# Patient Record
Sex: Female | Born: 1950 | Race: White | Hispanic: No | Marital: Married | Smoking: Never smoker
Health system: Southern US, Community
[De-identification: ages and names within clinical notes are randomized; demographics above are authoritative.]

## PROBLEM LIST (undated history)

## (undated) ENCOUNTER — Ambulatory Visit

## (undated) DIAGNOSIS — Z801 Family history of malignant neoplasm of trachea, bronchus and lung: Secondary | ICD-10-CM

## (undated) DIAGNOSIS — F419 Anxiety disorder, unspecified: Secondary | ICD-10-CM

## (undated) DIAGNOSIS — Z808 Family history of malignant neoplasm of other organs or systems: Secondary | ICD-10-CM

## (undated) DIAGNOSIS — Z8249 Family history of ischemic heart disease and other diseases of the circulatory system: Secondary | ICD-10-CM

## (undated) DIAGNOSIS — E039 Hypothyroidism, unspecified: Secondary | ICD-10-CM

## (undated) DIAGNOSIS — G629 Polyneuropathy, unspecified: Secondary | ICD-10-CM

## (undated) DIAGNOSIS — F32A Depression, unspecified: Secondary | ICD-10-CM

## (undated) DIAGNOSIS — C50919 Malignant neoplasm of unspecified site of unspecified female breast: Secondary | ICD-10-CM

## (undated) DIAGNOSIS — Z803 Family history of malignant neoplasm of breast: Secondary | ICD-10-CM

## (undated) HISTORY — DX: Family history of ischemic heart disease and other diseases of the circulatory system: Z82.49

## (undated) HISTORY — DX: Family history of malignant neoplasm of trachea, bronchus and lung: Z80.1

## (undated) HISTORY — PX: OVARY SURGERY: SHX727

## (undated) HISTORY — DX: Family history of malignant neoplasm of breast: Z80.3

## (undated) HISTORY — DX: Hypothyroidism, unspecified: E03.9

## (undated) HISTORY — DX: Polyneuropathy, unspecified: G62.9

## (undated) HISTORY — DX: Depression, unspecified: F32.A

## (undated) HISTORY — PX: APPENDECTOMY: SHX54

## (undated) HISTORY — DX: Malignant neoplasm of unspecified site of unspecified female breast: C50.919

## (undated) HISTORY — DX: Anxiety disorder, unspecified: F41.9

## (undated) HISTORY — DX: Family history of malignant neoplasm of other organs or systems: Z80.8

---

## 1998-04-27 ENCOUNTER — Other Ambulatory Visit: Admission: RE | Admit: 1998-04-27 | Discharge: 1998-04-27 | Payer: Self-pay | Admitting: Obstetrics & Gynecology

## 1999-05-09 ENCOUNTER — Encounter: Payer: Self-pay | Admitting: Obstetrics & Gynecology

## 1999-05-09 ENCOUNTER — Ambulatory Visit (HOSPITAL_COMMUNITY): Admission: RE | Admit: 1999-05-09 | Discharge: 1999-05-09 | Payer: Self-pay | Admitting: Obstetrics & Gynecology

## 1999-06-10 ENCOUNTER — Other Ambulatory Visit: Admission: RE | Admit: 1999-06-10 | Discharge: 1999-06-10 | Payer: Self-pay | Admitting: Obstetrics & Gynecology

## 2001-05-05 ENCOUNTER — Emergency Department (HOSPITAL_COMMUNITY): Admission: EM | Admit: 2001-05-05 | Discharge: 2001-05-06 | Payer: Self-pay | Admitting: Emergency Medicine

## 2001-05-06 ENCOUNTER — Encounter: Payer: Self-pay | Admitting: Emergency Medicine

## 2001-05-17 ENCOUNTER — Other Ambulatory Visit: Admission: RE | Admit: 2001-05-17 | Discharge: 2001-05-17 | Payer: Self-pay | Admitting: Obstetrics & Gynecology

## 2002-07-30 ENCOUNTER — Other Ambulatory Visit: Admission: RE | Admit: 2002-07-30 | Discharge: 2002-07-30 | Payer: Self-pay | Admitting: Obstetrics & Gynecology

## 2019-07-09 ENCOUNTER — Telehealth: Payer: Self-pay | Admitting: Hematology and Oncology

## 2019-07-09 NOTE — Telephone Encounter (Signed)
Received a new patient referral from Dr. Larose Hires office in [place] for breast cancer. Norma Murray is actively being treated. She has been cld and scheduled to see Norma Murray on 7/6 at 1pm. Pt aware to arrive 15 minutes early.

## 2019-08-11 NOTE — Progress Notes (Signed)
[facility] CONSULT NOTE  Patient Care Team: System, Pcp Not In as PCP - General  CHIEF COMPLAINTS/PURPOSE OF CONSULTATION:  Metastatic breast cancer, moved from [place]  HISTORY OF PRESENTING ILLNESS:  Norma Murray 69 y.o. female is here because of a history of metastatic breast cancer. She is referred by Dr. Lebron Quam in [place]. Right breast biopsy on 09/25/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 PET scan on 05/22/19 showed resolution of the multiple osseous metastatic lesions, right chest wall mass, and bilateral axillary lymphadenopathy. She is currently on treatment with anastrozole and Zometa. She also has a history of iron deficiency anemia and thrombocytopenia.  She had a bone marrow biopsy which also revealed presence of metastatic breast cancer in the bone marrow.  She presents to the clinic today for initial evaluation.   I reviewed her records extensively and collaborated the history with the patient.  SUMMARY OF ONCOLOGIC HISTORY: Oncology History  Metastatic breast cancer ([place])  09/25/2018 Initial Diagnosis   Right breast biopsy on 09/25/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    05/22/2019 PET scan   PET scan on 05/22/19 showed resolution of the multiple osseous metastatic lesions, right chest wall mass, and bilateral axillary lymphadenopathy.     Anti-estrogen oral therapy   Anastrozole and Zometa.     MEDICAL HISTORY:  Metastatic breast cancer, hypothyroidism SURGICAL HISTORY: No prior surgeries SOCIAL HISTORY: Denies any tobacco alcohol or recreational drug use FAMILY HISTORY: 2 of her aunts had breast cancer ALLERGIES:  has no allergies on file.  MEDICATIONS:  Current Outpatient Medications  Medication Sig Dispense Refill  . abemaciclib (VERZENIO) 150 MG tablet Take 1 tablet (150 mg total) by mouth 2 (two) times daily. Swallow tablets whole. Do not chew, crush, or split  tablets before swallowing.    . anastrozole (ARIMIDEX) 1 MG tablet Take 1 tablet (1 mg total) by mouth daily. 90 tablet 3  . levothyroxine (SYNTHROID) 75 MCG tablet Take 1 tablet (75 mcg total) by mouth daily before breakfast.     No current facility-administered medications for this visit.    REVIEW OF SYSTEMS:   Constitutional: Denies fevers, chills or abnormal night sweats Eyes: Denies blurriness of vision, double vision or watery eyes Ears, nose, mouth, throat, and face: Denies mucositis or sore throat Respiratory: Denies cough, dyspnea or wheezes Cardiovascular: Denies palpitation, chest discomfort or lower extremity swelling Gastrointestinal:  Denies nausea, heartburn or change in bowel habits Skin: Denies abnormal skin rashes Lymphatics: Denies new lymphadenopathy or easy bruising Neurological:Denies numbness, tingling or new weaknesses Behavioral/Psych: Mood is stable, no new changes  Breast: Palpable right breast mass All other systems were reviewed with the patient and are negative.  PHYSICAL EXAMINATION: ECOG PERFORMANCE STATUS: 1 - Symptomatic but completely ambulatory  Vitals:   08/12/19 1250  BP: (!) 146/90  Pulse: 77  Resp: 17  Temp: 98.2 F (36.8 C)  SpO2: 100%   Filed Weights   08/12/19 1250  Weight: 133 lb 1.6 oz (60.4 kg)    GENERAL:alert, no distress and comfortable SKIN: skin color, texture, turgor are normal, no rashes or significant lesions EYES: normal, conjunctiva are pink and non-injected, sclera clear OROPHARYNX:no exudate, no erythema and lips, buccal mucosa, and tongue normal  NECK: supple, thyroid normal size, non-tender, without nodularity LYMPH:  no palpable lymphadenopathy in the cervical, axillary or inguinal LUNGS: clear to auscultation and percussion with normal breathing effort HEART: regular rate & rhythm and no murmurs  and no lower extremity edema ABDOMEN:abdomen soft, non-tender and normal bowel sounds Musculoskeletal:no cyanosis  of digits and no clubbing  PSYCH: alert & oriented x 3 with fluent speech NEURO: no focal motor/sensory deficits   RADIOGRAPHIC STUDIES: I have personally reviewed the radiological reports and agreed with the findings in the report.  ASSESSMENT AND PLAN:  Metastatic breast cancer [facility]) Metastatic breast cancer: Referred by Dr. Lebron Quam in [place]. Right breast biopsy on 09/25/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  Patient moved from [place] because she could not tolerate the heat.  PET scan on 05/22/19 showed resolution of the multiple osseous metastatic lesions, right chest wall mass, and bilateral axillary lymphadenopathy.  Current treatment: Abemaciclib, anastrozole and Zometa  Patient appears to be getting Zometa every month. She has been getting a lot of benefit from Zometa.  Her pain appears to be improved after Zometa infusion.  Therefore we will keep her Zometa every monthly schedule.   All questions were answered. The patient knows to call the clinic with any problems, questions or concerns.   Rulon Eisenmenger, MD, MPH 08/12/2019    I, Molly Dorshimer, am acting as scribe for Nicholas Lose, MD.  I have reviewed the above documentation for accuracy and completeness, and I agree with the above.

## 2019-08-12 ENCOUNTER — Inpatient Hospital Stay: Payer: Medicare PPO | Attending: Hematology and Oncology | Admitting: Hematology and Oncology

## 2019-08-12 ENCOUNTER — Other Ambulatory Visit: Payer: Self-pay

## 2019-08-12 DIAGNOSIS — Z170 Estrogen receptor positive status [ER+]: Secondary | ICD-10-CM | POA: Diagnosis not present

## 2019-08-12 DIAGNOSIS — C7951 Secondary malignant neoplasm of bone: Secondary | ICD-10-CM | POA: Diagnosis not present

## 2019-08-12 DIAGNOSIS — Z79811 Long term (current) use of aromatase inhibitors: Secondary | ICD-10-CM | POA: Diagnosis not present

## 2019-08-12 DIAGNOSIS — C50911 Malignant neoplasm of unspecified site of right female breast: Secondary | ICD-10-CM | POA: Insufficient documentation

## 2019-08-12 DIAGNOSIS — E039 Hypothyroidism, unspecified: Secondary | ICD-10-CM | POA: Diagnosis not present

## 2019-08-12 DIAGNOSIS — C50919 Malignant neoplasm of unspecified site of unspecified female breast: Secondary | ICD-10-CM | POA: Insufficient documentation

## 2019-08-12 MED ORDER — LEVOTHYROXINE SODIUM 75 MCG PO TABS: 75.0000 ug | ORAL_TABLET | Freq: Every day | ORAL | Status: DC

## 2019-08-12 MED ORDER — ANASTROZOLE 1 MG PO TABS: 1.0000 mg | ORAL_TABLET | Freq: Every day | ORAL | 3 refills | Status: DC

## 2019-08-12 MED ORDER — ABEMACICLIB 150 MG PO TABS: 150.0000 mg | ORAL_TABLET | Freq: Two times a day (BID) | ORAL | Status: DC

## 2019-08-12 NOTE — Assessment & Plan Note (Signed)
Metastatic breast cancer: Referred by Dr. Lebron Quam in [place]. Right breast biopsy on 09/25/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   PET scan on 05/22/19 showed resolution of the multiple osseous metastatic lesions, right chest wall mass, and bilateral axillary lymphadenopathy.  Current treatment: Anastrozole and Zometa

## 2019-08-13 ENCOUNTER — Telehealth: Payer: Self-pay | Admitting: Hematology and Oncology

## 2019-08-13 NOTE — Telephone Encounter (Signed)
Scheduled per 7/6 los. Called and left a msg, mailing appt letter and calendar printout

## 2019-08-18 ENCOUNTER — Telehealth: Payer: Self-pay | Admitting: Licensed Clinical Social Worker

## 2019-08-18 NOTE — Telephone Encounter (Signed)
CSW attempted to contact patient who is new to this office to introduce support services. No answer, left VM with direct contact information.   Edwinna Areola Anayiah Howden, LCSW

## 2019-08-20 ENCOUNTER — Other Ambulatory Visit: Payer: Self-pay

## 2019-08-20 ENCOUNTER — Inpatient Hospital Stay: Payer: Medicare PPO

## 2019-08-20 ENCOUNTER — Encounter: Payer: Self-pay | Admitting: Hematology and Oncology

## 2019-08-20 VITALS — BP 165/82 | HR 64 | Temp 98.3°F | Resp 16 | Wt 133.1 lb

## 2019-08-20 DIAGNOSIS — C50919 Malignant neoplasm of unspecified site of unspecified female breast: Secondary | ICD-10-CM

## 2019-08-20 DIAGNOSIS — C50911 Malignant neoplasm of unspecified site of right female breast: Secondary | ICD-10-CM | POA: Diagnosis not present

## 2019-08-20 LAB — CBC WITH DIFFERENTIAL (CANCER CENTER ONLY)
Abs Immature Granulocytes: 0.01 10*3/uL (ref 0.00–0.07)
Basophils Absolute: 0.1 10*3/uL (ref 0.0–0.1)
Basophils Relative: 2 %
Eosinophils Absolute: 0.1 10*3/uL (ref 0.0–0.5)
Eosinophils Relative: 3 %
HCT: 37.8 % (ref 36.0–46.0)
Hemoglobin: 12.4 g/dL (ref 12.0–15.0)
Immature Granulocytes: 0 %
Lymphocytes Relative: 37 %
Lymphs Abs: 1.2 10*3/uL (ref 0.7–4.0)
MCH: 34.4 pg — ABNORMAL HIGH (ref 26.0–34.0)
MCHC: 32.8 g/dL (ref 30.0–36.0)
MCV: 105 fL — ABNORMAL HIGH (ref 80.0–100.0)
Monocytes Absolute: 0.3 10*3/uL (ref 0.1–1.0)
Monocytes Relative: 11 %
Neutro Abs: 1.5 10*3/uL — ABNORMAL LOW (ref 1.7–7.7)
Neutrophils Relative %: 47 %
Platelet Count: 144 10*3/uL — ABNORMAL LOW (ref 150–400)
RBC: 3.6 MIL/uL — ABNORMAL LOW (ref 3.87–5.11)
RDW: 13.8 % (ref 11.5–15.5)
WBC Count: 3.2 10*3/uL — ABNORMAL LOW (ref 4.0–10.5)
nRBC: 0 % (ref 0.0–0.2)

## 2019-08-20 LAB — CMP (CANCER CENTER ONLY)
ALT: 126 U/L — ABNORMAL HIGH (ref 0–44)
AST: 34 U/L (ref 15–41)
Albumin: 4 g/dL (ref 3.5–5.0)
Alkaline Phosphatase: 117 U/L (ref 38–126)
Anion gap: 8 (ref 5–15)
BUN: 17 mg/dL (ref 8–23)
CO2: 23 mmol/L (ref 22–32)
Calcium: 9.2 mg/dL (ref 8.9–10.3)
Chloride: 110 mmol/L (ref 98–111)
Creatinine: 1.01 mg/dL — ABNORMAL HIGH (ref 0.44–1.00)
GFR, Est AFR Am: >60 mL/min (ref >60)
GFR, Estimated: 57 mL/min — ABNORMAL LOW (ref >60)
Glucose, Bld: 88 mg/dL (ref 70–99)
Potassium: 4.1 mmol/L (ref 3.5–5.1)
Sodium: 141 mmol/L (ref 135–145)
Total Bilirubin: 0.5 mg/dL (ref 0.3–1.2)
Total Protein: 6.9 g/dL (ref 6.5–8.1)

## 2019-08-20 MED ORDER — ZOLEDRONIC ACID 4 MG/100ML IV SOLN
4.0000 mg | Freq: Once | INTRAVENOUS | Status: AC
  Administered 2019-08-20: 4 mg via INTRAVENOUS

## 2019-08-20 MED ORDER — ZOLEDRONIC ACID 4 MG/100ML IV SOLN
INTRAVENOUS | Status: AC
  Filled 2019-08-20: qty 100

## 2019-08-20 MED ORDER — SODIUM CHLORIDE 0.9 % IV SOLN
Freq: Once | INTRAVENOUS | Status: AC
  Filled 2019-08-20: qty 250

## 2019-08-20 NOTE — Patient Instructions (Signed)
Zoledronic Acid injection (Hypercalcemia, Oncology) What is this medicine? ZOLEDRONIC ACID (ZOE le dron ik AS id) lowers the amount of calcium loss from bone. It is used to treat too much calcium in your blood from cancer. It is also used to prevent complications of cancer that has spread to the bone. This medicine may be used for other purposes; ask your health care provider or pharmacist if you have questions. COMMON BRAND NAME(S): Zometa What should I tell my health care provider before I take this medicine? They need to know if you have any of these conditions:  aspirin-sensitive asthma  cancer, especially if you are receiving medicines used to treat cancer  dental disease or wear dentures  infection  kidney disease  receiving corticosteroids like dexamethasone or prednisone  an unusual or allergic reaction to zoledronic acid, other medicines, foods, dyes, or preservatives  pregnant or trying to get pregnant  breast-feeding How should I use this medicine? This medicine is for infusion into a vein. It is given by a health care professional in a hospital or clinic setting. Talk to your pediatrician regarding the use of this medicine in children. Special care may be needed. Overdosage: If you think you have taken too much of this medicine contact a poison control center or emergency room at once. NOTE: This medicine is only for you. Do not share this medicine with others. What if I miss a dose? It is important not to miss your dose. Call your doctor or health care professional if you are unable to keep an appointment. What may interact with this medicine?  certain antibiotics given by injection  NSAIDs, medicines for pain and inflammation, like ibuprofen or naproxen  some diuretics like bumetanide, furosemide  teriparatide  thalidomide This list may not describe all possible interactions. Give your health care provider a list of all the medicines, herbs, non-prescription  drugs, or dietary supplements you use. Also tell them if you smoke, drink alcohol, or use illegal drugs. Some items may interact with your medicine. What should I watch for while using this medicine? Visit your doctor or health care professional for regular checkups. It may be some time before you see the benefit from this medicine. Do not stop taking your medicine unless your doctor tells you to. Your doctor may order blood tests or other tests to see how you are doing. Women should inform their doctor if they wish to become pregnant or think they might be pregnant. There is a potential for serious side effects to an unborn child. Talk to your health care professional or pharmacist for more information. You should make sure that you get enough calcium and vitamin D while you are taking this medicine. Discuss the foods you eat and the vitamins you take with your health care professional. Some people who take this medicine have severe bone, joint, and/or muscle pain. This medicine may also increase your risk for jaw problems or a broken thigh bone. Tell your doctor right away if you have severe pain in your jaw, bones, joints, or muscles. Tell your doctor if you have any pain that does not go away or that gets worse. Tell your dentist and dental surgeon that you are taking this medicine. You should not have major dental surgery while on this medicine. See your dentist to have a dental exam and fix any dental problems before starting this medicine. Take good care of your teeth while on this medicine. Make sure you see your dentist for regular follow-up   appointments. What side effects may I notice from receiving this medicine? Side effects that you should report to your doctor or health care professional as soon as possible:  allergic reactions like skin rash, itching or hives, swelling of the face, lips, or tongue  anxiety, confusion, or depression  breathing problems  changes in vision  eye  pain  feeling faint or lightheaded, falls  jaw pain, especially after dental work  mouth sores  muscle cramps, stiffness, or weakness  redness, blistering, peeling or loosening of the skin, including inside the mouth  trouble passing urine or change in the amount of urine Side effects that usually do not require medical attention (report to your doctor or health care professional if they continue or are bothersome):  bone, joint, or muscle pain  constipation  diarrhea  fever  hair loss  irritation at site where injected  loss of appetite  nausea, vomiting  stomach upset  trouble sleeping  trouble swallowing  weak or tired This list may not describe all possible side effects. Call your doctor for medical advice about side effects. You may report side effects to FDA at 1-800-FDA-1088. Where should I keep my medicine? This drug is given in a hospital or clinic and will not be stored at home. NOTE: This sheet is a summary. It may not cover all possible information. If you have questions about this medicine, talk to your doctor, pharmacist, or health care provider.  2020 Elsevier/Gold Standard (2013-06-21 14:19:39)  

## 2019-08-20 NOTE — Progress Notes (Signed)
Met with patient at registration to introduce myself as Financial Resource Specialist and to offer available resources.  Discussed one-time $1000 [employer] grant and qualifications to assist with personal expenses while going through treatment.  Gave her my card if interested in applying and for any additional financial questions or concerns.   

## 2019-08-26 ENCOUNTER — Telehealth: Payer: Self-pay | Admitting: Genetic Counselor

## 2019-08-26 ENCOUNTER — Telehealth: Payer: Self-pay | Admitting: Hematology and Oncology

## 2019-08-26 NOTE — Telephone Encounter (Signed)
I discussed elevation of ALT with the patient. ALT: 126 Bilirubin: 0.5 AST: 34 Based on the guidelines, there is no dosage adjustment needed for this at this time. We will recheck her blood work in August when she comes back for follow-up to decide on any dosing changes.

## 2019-08-26 NOTE — Telephone Encounter (Signed)
Returned Norma Murray's phone call regarding her questions about genetics. She is concerned about the cost of genetic testing since both she and her husband are not working right now and they recently moved from [place]. We discussed that she may have a cost for both the genetic counseling appointment and the genetic testing itself. We reviewed the billing policies of the genetic testing laboratory and discussed that she does meet medical criteria for genetic testing. To aid in her decision-making, we will request a pre-test benefits investigation from the genetic testing laboratory ([facility]) to better determine what her out of pocket cost may be.

## 2019-08-28 ENCOUNTER — Telehealth: Payer: Self-pay | Admitting: Genetic Counselor

## 2019-08-28 NOTE — Telephone Encounter (Signed)
Discussed with Norma Murray that the estimated out of pocket cost for genetic testing is $0, according to the genetic testing laboratory ([facility]). She understands that there will still be a charge for the genetic counseling appointment and feels comfortable keeping her appointment for Tuesday 7/27.

## 2019-09-02 ENCOUNTER — Other Ambulatory Visit: Payer: Self-pay | Admitting: Genetic Counselor

## 2019-09-02 ENCOUNTER — Encounter: Payer: Self-pay | Admitting: Genetic Counselor

## 2019-09-02 ENCOUNTER — Inpatient Hospital Stay (HOSPITAL_BASED_OUTPATIENT_CLINIC_OR_DEPARTMENT_OTHER): Payer: Medicare PPO | Admitting: Genetic Counselor

## 2019-09-02 ENCOUNTER — Inpatient Hospital Stay: Payer: Medicare PPO

## 2019-09-02 ENCOUNTER — Other Ambulatory Visit: Payer: Self-pay

## 2019-09-02 DIAGNOSIS — Z803 Family history of malignant neoplasm of breast: Secondary | ICD-10-CM | POA: Diagnosis not present

## 2019-09-02 DIAGNOSIS — Z808 Family history of malignant neoplasm of other organs or systems: Secondary | ICD-10-CM | POA: Diagnosis not present

## 2019-09-02 DIAGNOSIS — C50919 Malignant neoplasm of unspecified site of unspecified female breast: Secondary | ICD-10-CM

## 2019-09-02 DIAGNOSIS — Z801 Family history of malignant neoplasm of trachea, bronchus and lung: Secondary | ICD-10-CM | POA: Diagnosis not present

## 2019-09-02 NOTE — Progress Notes (Signed)
REFERRING PROVIDER: Nicholas Lose, MD [place],  [place] [id number]  PRIMARY PROVIDER:  System, Pcp Not In  PRIMARY REASON FOR VISIT:  1. Metastatic breast cancer ([place])   2. Family history of breast cancer   3. Family history of bone cancer   4. Family history of lung cancer      HISTORY OF PRESENT ILLNESS:   Norma Murray, a 69 y.o. female, was seen for a [place] cancer genetics consultation at the request of Dr. Lindi Adie due to a personal and family history of cancer.  Norma Murray presents to clinic today to discuss the possibility of a hereditary predisposition to cancer, genetic testing, and to further clarify her future cancer risks, as well as potential cancer risks for family members.   In 2020, at the age of 28, Norma Murray was diagnosed with metastatic invasive ductal carcinoma, ER+/PR+/Her2-, of the right breast. The treatment plan currently includes anastrozole and Zometa.    CANCER HISTORY:  Oncology History  Metastatic breast cancer ([place])  09/25/2018 Initial Diagnosis   Right breast biopsy on 09/25/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    05/22/2019 PET scan   PET scan on 05/22/19 showed resolution of the multiple osseous metastatic lesions, right chest wall mass, and bilateral axillary lymphadenopathy.     Anti-estrogen oral therapy   Anastrozole and Zometa.      RISK FACTORS:  Menarche was at age 41.  First live birth at age 36.  OCP use for approximately 1 month.  Ovaries intact: One fallopian tube and ovary.  Hysterectomy: no.  Menopausal status: postmenopausal.  HRT use: 0 years. Colonoscopy: no; not examined. Mammogram within the last year: yes. Any excessive radiation exposure in the past: no   Past Medical History:  Diagnosis Date  . Family history of bone cancer   . Family history of breast cancer   . Family history of lung cancer      Social History   Socioeconomic History  .  Marital status: Married    Spouse name: Not on file  . Number of children: Not on file  . Years of education: Not on file  . Highest education level: Not on file  Occupational History  . Not on file  Tobacco Use  . Smoking status: Not on file  Substance and Sexual Activity  . Alcohol use: Not on file  . Drug use: Not on file  . Sexual activity: Not on file  Other Topics Concern  . Not on file  Social History Narrative  . Not on file   Social Determinants of Health   Financial Resource Strain:   . Difficulty of Paying Living Expenses:   Food Insecurity:   . Worried About Charity fundraiser in the Last Year:   . Arboriculturist in the Last Year:   Transportation Needs:   . Film/video editor (Medical):   Marland Kitchen Lack of Transportation (Non-Medical):   Physical Activity:   . Days of Exercise per Week:   . Minutes of Exercise per Session:   Stress:   . Feeling of Stress :   Social Connections:   . Frequency of Communication with Friends and Family:   . Frequency of Social Gatherings with Friends and Family:   . Attends Religious Services:   . Active Member of Clubs or Organizations:   . Attends Archivist Meetings:   Marland Kitchen Marital Status:      FAMILY HISTORY:  We obtained a detailed, 4-generation family history.  Significant diagnoses are listed below: Family History  Problem Relation Age of Onset  . Breast cancer Maternal Aunt 30  . Mesothelioma Maternal Uncle        worked on railroad  . Cirrhosis Maternal Grandfather        liver  . Breast cancer Maternal Aunt 47  . Lung cancer Maternal Uncle 51       smoker   Norma Murray has one son (age 31) and four grandchildren. She has two sisters and one brother (ages 6-64). None of these family members have had cancer.  Norma Murray mother is 32 and has not had cancer. Norma Murray had two maternal aunts and two maternal uncles. One aunt died from breast cancer in her 12s, and the other aunt died from breast cancer in  her 57s. One uncle had mesothelioma and was a Veterinary surgeon. The other uncle died from lung cancer at the age of 55 and was a smoker. There is no known cancer among her maternal first cousins. Her maternal grandmother died at the age of 82, and her maternal grandfather died in his 60s due to cirrhosis of the liver.   Norma Murray father died at the age of 46 from heart problems. She had three paternal uncles and one paternal aunt. Her aunt died in her 83s from bone cancer. There is no known cancer among paternal first cousins. Her paternal grandmother died at the age of 21 and her paternal grandfather died at the age of 52 without cancer.   Norma Murray is unaware of previous family history of genetic testing for hereditary cancer risks. Patient's maternal ancestors are of [place] and [place] descent, and paternal ancestors are of [place] descent. There is no reported Ashkenazi Jewish ancestry. There is no known consanguinity.  GENETIC COUNSELING ASSESSMENT: Norma Murray is a 69 y.o. female with a personal and family history of breast cancer, which is somewhat suggestive of a hereditary cancer syndrome and predisposition to cancer. We, therefore, discussed and recommended the following at today's visit.   DISCUSSION: We discussed that approximately 5-10% of breast cancer is hereditary, with most cases associated with the BRCA1 and BRCA2 genes. There are other genes that can be associated with hereditary breast cancer syndromes. These include ATM, CHEK2, PALB2, etc. We discussed that testing is beneficial for several reasons, including identifying whether targeted treatment options such as PARP inhibitors would be beneficial, knowing about other cancer risks, identifying potential screening and risk-reduction options that may be appropriate, and to understand if other family members could be at risk for cancer and allow them to undergo genetic testing.   We reviewed the characteristics, features and inheritance  patterns of hereditary cancer syndromes. We also discussed genetic testing, including the appropriate family members to test, the process of testing, insurance coverage and turn-around-time for results. We discussed the implications of a negative, positive and/or variant of uncertain significant result. We recommended Norma Murray pursue genetic testing for the [facility] Common Hereditary Cancers panel.   The Common Hereditary Cancers Panel offered by [facility] includes sequencing and/or deletion duplication testing of the following 48 genes: APC, ATM, AXIN2, BARD1, BMPR1A, BRCA1, BRCA2, BRIP1, CDH1, CDK4, CDKN2A (p14ARF), CDKN2A (p16INK4a), CHEK2, CTNNA1, DICER1, EPCAM (Deletion/duplication testing only), GREM1 (promoter region deletion/duplication testing only), KIT, MEN1, MLH1, MSH2, MSH3, MSH6, MUTYH, NBN, NF1, NTHL1, PALB2, PDGFRA, PMS2, POLD1, POLE, PTEN, RAD50, RAD51C, RAD51D, RNF43, SDHB, SDHC, SDHD, SMAD4, SMARCA4. STK11, TP53, TSC1, TSC2, and VHL.  The following genes were evaluated for sequence changes only: SDHA and HOXB13 c.251G>A variant only.  Based on Norma Murray's personal and family history of cancer, she meets medical criteria for genetic testing. Despite that she meets criteria, she may still have an out of pocket cost. We discussed that if her out of pocket cost for testing is over $100, the laboratory will reach out to let her know. If the out of pocket cost of testing is less than $100 she will be billed by the genetic testing laboratory. We previously requested a pre-test benefits investigation from [facility], which estimated that Norma Murray's out of pocket cost for testing will be $0.  PLAN: After considering the risks, benefits, and limitations, Norma Murray provided informed consent to pursue genetic testing and the saliva sample was sent to [facility] for analysis of the Common Hereditary Cancers Panel. Results should be available within approximately two-three weeks' time, at which  point they will be disclosed by telephone to Norma Murray, as will any additional recommendations warranted by these results. Norma Murray will receive a summary of her genetic counseling visit and a copy of her results once available. This information will also be available in Epic.   Norma Murray questions were answered to her satisfaction today. Our contact information was provided should additional questions or concerns arise. Thank you for the referral and allowing [place] to share in the care of your patient.   Clint Guy, [place], [facility] Licensed, Certified Dispensing [email or web address] Phone: [phone]  The patient was seen for a total of 35 minutes in face-to-face genetic counseling.  This patient was discussed with Drs. Magrinat, Lindi Adie and/or Burr Medico who agrees with the above.    _______________________________________________________________________ For Office Staff:  Number of people involved in session: 1 Was an Intern/ student involved with case: no

## 2019-09-10 ENCOUNTER — Ambulatory Visit: Payer: Self-pay | Admitting: Genetic Counselor

## 2019-09-10 ENCOUNTER — Encounter: Payer: Self-pay | Admitting: Genetic Counselor

## 2019-09-10 ENCOUNTER — Telehealth: Payer: Self-pay | Admitting: Genetic Counselor

## 2019-09-10 DIAGNOSIS — Z1379 Encounter for other screening for genetic and chromosomal anomalies: Secondary | ICD-10-CM

## 2019-09-10 NOTE — Telephone Encounter (Signed)
Revealed negative genetic testing. Discussed that we do not know why she has breast cancer or why there is cancer in the family. There could be a genetic mutation in the family that [name] did not inherit. There could also be a mutation in a different gene that we are not testing, or our current technology may not be able detect certain mutations. It will therefore be important for her to stay in contact with genetics to keep up with whether additional testing may be appropriate in the future.

## 2019-09-10 NOTE — Progress Notes (Signed)
HPI:  Norma Murray was previously seen in the [place] clinic due to a personal and family history of cancer and concerns regarding a hereditary predisposition to cancer. Please refer to our prior cancer genetics clinic note for more information regarding our discussion, assessment and recommendations, at the time. Norma Murray recent genetic test results were disclosed to her, as were recommendations warranted by these results. These results and recommendations are discussed in more detail below.  CANCER HISTORY:  Oncology History  Metastatic breast cancer ([place])  09/25/2018 Initial Diagnosis   Right breast biopsy on 09/25/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    05/22/2019 PET scan   PET scan on 05/22/19 showed resolution of the multiple osseous metastatic lesions, right chest wall mass, and bilateral axillary lymphadenopathy.     Anti-estrogen oral therapy   Anastrozole and Zometa.   09/10/2019 Genetic Testing   Negative genetic testing:  No pathogenic variants detected on the [facility] Common Hereditary Cancers panel. The report date is 09/10/2019.   The Common Hereditary Cancers Panel offered by [facility] includes sequencing and/or deletion duplication testing of the following 48 genes: APC, ATM, AXIN2, BARD1, BMPR1A, BRCA1, BRCA2, BRIP1, CDH1, CDK4, CDKN2A (p14ARF), CDKN2A (p16INK4a), CHEK2, CTNNA1, DICER1, EPCAM (Deletion/duplication testing only), GREM1 (promoter region deletion/duplication testing only), KIT, MEN1, MLH1, MSH2, MSH3, MSH6, MUTYH, NBN, NF1, NTHL1, PALB2, PDGFRA, PMS2, POLD1, POLE, PTEN, RAD50, RAD51C, RAD51D, RNF43, SDHB, SDHC, SDHD, SMAD4, SMARCA4. STK11, TP53, TSC1, TSC2, and VHL.  The following genes were evaluated for sequence changes only: SDHA and HOXB13 c.251G>A variant only.      FAMILY HISTORY:  We obtained a detailed, 4-generation family history.  Significant diagnoses are listed below: Family History  Problem  Relation Age of Onset  . Breast cancer Maternal Aunt 30  . Mesothelioma Maternal Uncle        worked on railroad  . Cirrhosis Maternal Grandfather        liver  . Breast cancer Maternal Aunt 42  . Lung cancer Maternal Uncle 61       smoker   Norma Murray has one son (age 56) and four grandchildren. She has two sisters and one brother (ages 83-64). None of these family members have had cancer.  Norma Murray mother is 3 and has not had cancer. Norma Murray had two maternal aunts and two maternal uncles. One aunt died from breast cancer in her 71s, and the other aunt died from breast cancer in her 71s. One uncle had mesothelioma and was a Veterinary surgeon. The other uncle died from lung cancer at the age of 23 and was a smoker. There is no known cancer among her maternal first cousins. Her maternal grandmother died at the age of 20, and her maternal grandfather died in his 26s due to cirrhosis of the liver.   Ms. Ellinger father died at the age of 47 from heart problems. She had three paternal uncles and one paternal aunt. Her aunt died in her 53s from bone cancer. There is no known cancer among paternal first cousins. Her paternal grandmother died at the age of 65 and her paternal grandfather died at the age of 26 without cancer.   Ms. Schlee is unaware of previous family history of genetic testing for hereditary cancer risks. Patient's maternal ancestors are of [place] and [place] descent, and paternal ancestors are of [place] descent. There is no reported Ashkenazi Jewish ancestry. There is no known consanguinity.  GENETIC TEST RESULTS: Genetic testing  reported out on 09/10/2019 through the [facility] Common Hereditary Cancers panel. No pathogenic variants were detected.   The Common Hereditary Cancers Panel offered by [facility] includes sequencing and/or deletion duplication testing of the following 48 genes: APC, ATM, AXIN2, BARD1, BMPR1A, BRCA1, BRCA2, BRIP1, CDH1, CDK4, CDKN2A (p14ARF), CDKN2A (p16INK4a),  CHEK2, CTNNA1, DICER1, EPCAM (Deletion/duplication testing only), GREM1 (promoter region deletion/duplication testing only), KIT, MEN1, MLH1, MSH2, MSH3, MSH6, MUTYH, NBN, NF1, NTHL1, PALB2, PDGFRA, PMS2, POLD1, POLE, PTEN, RAD50, RAD51C, RAD51D, RNF43, SDHB, SDHC, SDHD, SMAD4, SMARCA4. STK11, TP53, TSC1, TSC2, and VHL.  The following genes were evaluated for sequence changes only: SDHA and HOXB13 c.251G>A variant only. The test report will be scanned into EPIC and located under the Molecular Pathology section of the Results Review tab.  A portion of the result report is included below for reference.     We discussed with Norma Murray that because current genetic testing is not perfect, it is possible there may be a gene mutation in one of these genes that current testing cannot detect, but that chance is small.  We also discussed that there could be another gene that has not yet been discovered, or that we have not yet tested, that is responsible for the cancer diagnoses in the family. It is also possible there is a hereditary cause for the cancer in the family that Norma Murray did not inherit and therefore was not identified in her testing.  Therefore, it is important to remain in touch with cancer genetics in the future so that we can continue to offer Norma Murray the most up to date genetic testing.   CANCER SCREENING RECOMMENDATIONS: Norma Murray test result is considered negative (normal).  This means that we have not identified a hereditary cause for her personal and family history of cancer at this time. While reassuring, this does not definitively rule out a hereditary predisposition to cancer. It is still possible that there could be genetic mutations that are undetectable by current technology. There could be genetic mutations in genes that have not been tested or identified to increase cancer risk.  Therefore, it is recommended she continue to follow the cancer management and screening guidelines provided  by her oncology and primary healthcare providers.   An individual's cancer risk and medical management are not determined by genetic test results alone. Overall cancer risk assessment incorporates additional factors, including personal medical history, family history, and any available genetic information that may result in a personalized plan for cancer prevention and surveillance.  RECOMMENDATIONS FOR FAMILY MEMBERS:  Individuals in this family might be at some increased risk of developing cancer, over the general population risk, simply due to the family history of cancer.  We recommended women in this family have a yearly mammogram beginning at age 44, or 44 years younger than the earliest onset of cancer, an annual clinical breast exam, and perform monthly breast self-exams. Women in this family should also have a gynecological exam as recommended by their primary provider. All family members should be referred for colonoscopy starting at age 7.  It is also possible there is a hereditary cause for the cancer in Norma Murray family that she did not inherit and therefore was not identified in her.  Based on Norma Murray family history, we recommended her mother and/or siblings, consider genetic counseling and testing. Norma Murray will let us know if we can be of any assistance in coordinating genetic counseling and/or testing for these family members.   FOLLOW-UP: Lastly,  we discussed with Norma Murray that cancer genetics is a rapidly advancing field and it is possible that new genetic tests will be appropriate for her and/or her family members in the future. We encouraged her to remain in contact with cancer genetics on an annual basis so we can update her personal and family histories and let her know of advances in cancer genetics that may benefit this family.   Our contact number was provided. Ms. Braziel questions were answered to her satisfaction, and she knows she is welcome to call us at anytime with  additional questions or concerns.   Clint Guy, MS, [facility] Genetic Counselor [email or web address] Phone: [phone]

## 2019-09-17 NOTE — Progress Notes (Signed)
Patient Care Team: System, Pcp Not In as PCP - General  DIAGNOSIS:    ICD-10-CM   1. Metastatic breast cancer ([place])  C50.919 CBC with Differential ([place])    CMP ([place] only)    NM PET Image Restag (PS) Skull Base To Thigh    SUMMARY OF ONCOLOGIC HISTORY: Oncology History  Metastatic breast cancer ([place])  09/25/2018 Initial Diagnosis   Right breast biopsy on 09/25/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    05/22/2019 PET scan   PET scan on 05/22/19 showed resolution of the multiple osseous metastatic lesions, right chest wall mass, and bilateral axillary lymphadenopathy.     Anti-estrogen oral therapy   Anastrozole and Zometa.   09/10/2019 Genetic Testing   Negative genetic testing:  No pathogenic variants detected on the [facility] Common Hereditary Cancers panel. The report date is 09/10/2019.   The Common Hereditary Cancers Panel offered by [facility] includes sequencing and/or deletion duplication testing of the following 48 genes: APC, ATM, AXIN2, BARD1, BMPR1A, BRCA1, BRCA2, BRIP1, CDH1, CDK4, CDKN2A (p14ARF), CDKN2A (p16INK4a), CHEK2, CTNNA1, DICER1, EPCAM (Deletion/duplication testing only), GREM1 (promoter region deletion/duplication testing only), KIT, MEN1, MLH1, MSH2, MSH3, MSH6, MUTYH, NBN, NF1, NTHL1, PALB2, PDGFRA, PMS2, POLD1, POLE, PTEN, RAD50, RAD51C, RAD51D, RNF43, SDHB, SDHC, SDHD, SMAD4, SMARCA4. STK11, TP53, TSC1, TSC2, and VHL.  The following genes were evaluated for sequence changes only: SDHA and HOXB13 c.251G>A variant only.      CHIEF COMPLIANT: Follow-up of metastatic breast cancer   INTERVAL HISTORY: Norma Murray is a 69 y.o. with above-mentioned history of metastatic breast cancer currently on treatment with Abemaciclib, anastrozole, and Zometa. She presents to the clinic today for follow-up.  She reports to be tolerating the treatment fairly well.  The pain is under good control.  She received Zometa last  month.  She wants to receive Zometa every 3 months instead. Today her LFTs are markedly elevated.  Therefore we will have to hold abemaciclib.  ALLERGIES:  has no allergies on file.  MEDICATIONS:  Current Outpatient Medications  Medication Sig Dispense Refill  . abemaciclib (VERZENIO) 150 MG tablet Take 1 tablet (150 mg total) by mouth 2 (two) times daily. Swallow tablets whole. Do not chew, crush, or split tablets before swallowing.    . anastrozole (ARIMIDEX) 1 MG tablet Take 1 tablet (1 mg total) by mouth daily. 90 tablet 3  . levothyroxine (SYNTHROID) 75 MCG tablet Take 1 tablet (75 mcg total) by mouth daily before breakfast.     No current facility-administered medications for this visit.    PHYSICAL EXAMINATION: ECOG PERFORMANCE STATUS: 1 - Symptomatic but completely ambulatory  Vitals:   09/18/19 0942  BP: (!) 157/85  Pulse: 74  Resp: 18  Temp: 98.3 F (36.8 C)  SpO2: 100%   Filed Weights   09/18/19 0942  Weight: 134 lb 12.8 oz (61.1 kg)    LABORATORY DATA:  I have reviewed the data as listed CMP Latest Ref Rng & Units 09/18/2019 08/20/2019  Glucose 70 - 99 mg/dL 83 88  BUN 8 - 23 mg/dL 16 17  Creatinine 0.44 - 1.00 mg/dL 1.00 1.01(H)  Sodium 135 - 145 mmol/L 142 141  Potassium 3.5 - 5.1 mmol/L 4.4 4.1  Chloride 98 - 111 mmol/L 110 110  CO2 22 - 32 mmol/L 22 23  Calcium 8.9 - 10.3 mg/dL 10.2 9.2  Total Protein 6.5 - 8.1 g/dL 7.0 6.9  Total Bilirubin 0.3 - 1.2 mg/dL 0.5 0.5  Alkaline Phos 38 - 126 U/L 266(H) 117  AST 15 - 41 U/L 186(HH) 34  ALT 0 - 44 U/L 286(HH) 126(H)    Lab Results  Component Value Date   WBC 2.8 (L) 09/18/2019   HGB 13.1 09/18/2019   HCT 40.0 09/18/2019   MCV 104.7 (H) 09/18/2019   PLT 118 (L) 09/18/2019   NEUTROABS 1.5 (L) 09/18/2019    ASSESSMENT & PLAN:  Metastatic breast cancer ([place]) Metastatic breast cancer: Referred by Dr. Lebron Quam in [place]. Right breast biopsy on 09/25/18 showed invasive ductal carcinoma, grade 2, ER/PR  positive, HER-2 negative.  Patient moved from [place] because she could not tolerate the heat.  PET scan on 05/22/19 showed resolution of the multiple osseous metastatic lesions, right chest wall mass, and bilateral axillary lymphadenopathy.  Current treatment: Abemaciclib, anastrozole and Zometa   Toxicities: 1.  Elevated LFTs 2. ANC 1.5 3.  Thrombocytopenia: Platelets 118  Bone metastases: On Zometa, will switch her to every 28-monthZometa. Elevated LFTs: We will hold abemaciclib for a week and recheck labs next week. I renewed her prescription for anastrozole. We will plan to obtain PET CT scan in October 2021.     Orders Placed This Encounter  Procedures  . NM PET Image Restag (PS) Skull Base To Thigh    Standing Status:   Future    Standing Expiration Date:   09/17/2020    Order Specific Question:   If indicated for the ordered procedure, I authorize the administration of a radiopharmaceutical per Radiology protocol    Answer:   Yes    Order Specific Question:   Preferred [facility]?    [facility]   Order Specific Question:   Release to patient    Answer:   Immediate    Order Specific Question:   Radiology Contrast Protocol - do NOT remove file path    Answer:   [email or web address]  . CBC with Differential ([place])    Standing Status:   Future    Standing Expiration Date:   09/17/2020  . CMP ([place])    Standing Status:   Future    Standing Expiration Date:   09/17/2020   The patient has a good understanding of the overall plan. she agrees with it. she will call with any problems that may develop before the next visit here.  Total time spent: 30 mins including face to face time and time spent for planning, charting and coordination of care  GNicholas Lose MD 09/18/2019  I, MCloyde ReamsDorshimer, am acting as scribe for Dr. VNicholas Lose  I have reviewed the above documentation for accuracy and  completeness, and I agree with the above.

## 2019-09-18 ENCOUNTER — Inpatient Hospital Stay: Payer: Medicare PPO

## 2019-09-18 ENCOUNTER — Encounter: Payer: Self-pay | Admitting: *Deleted

## 2019-09-18 ENCOUNTER — Telehealth: Payer: Self-pay | Admitting: Hematology and Oncology

## 2019-09-18 ENCOUNTER — Inpatient Hospital Stay: Payer: Medicare PPO | Attending: Hematology and Oncology | Admitting: Hematology and Oncology

## 2019-09-18 ENCOUNTER — Other Ambulatory Visit: Payer: Self-pay

## 2019-09-18 DIAGNOSIS — C50911 Malignant neoplasm of unspecified site of right female breast: Secondary | ICD-10-CM | POA: Insufficient documentation

## 2019-09-18 DIAGNOSIS — D696 Thrombocytopenia, unspecified: Secondary | ICD-10-CM | POA: Diagnosis not present

## 2019-09-18 DIAGNOSIS — C50919 Malignant neoplasm of unspecified site of unspecified female breast: Secondary | ICD-10-CM

## 2019-09-18 DIAGNOSIS — R7989 Other specified abnormal findings of blood chemistry: Secondary | ICD-10-CM | POA: Insufficient documentation

## 2019-09-18 DIAGNOSIS — Z170 Estrogen receptor positive status [ER+]: Secondary | ICD-10-CM | POA: Insufficient documentation

## 2019-09-18 DIAGNOSIS — Z79811 Long term (current) use of aromatase inhibitors: Secondary | ICD-10-CM | POA: Insufficient documentation

## 2019-09-18 DIAGNOSIS — C7951 Secondary malignant neoplasm of bone: Secondary | ICD-10-CM | POA: Diagnosis not present

## 2019-09-18 LAB — CBC WITH DIFFERENTIAL (CANCER CENTER ONLY)
Abs Immature Granulocytes: 0 10*3/uL (ref 0.00–0.07)
Basophils Absolute: 0.1 10*3/uL (ref 0.0–0.1)
Basophils Relative: 2 %
Eosinophils Absolute: 0.1 10*3/uL (ref 0.0–0.5)
Eosinophils Relative: 4 %
HCT: 40 % (ref 36.0–46.0)
Hemoglobin: 13.1 g/dL (ref 12.0–15.0)
Immature Granulocytes: 0 %
Lymphocytes Relative: 31 %
Lymphs Abs: 0.9 10*3/uL (ref 0.7–4.0)
MCH: 34.3 pg — ABNORMAL HIGH (ref 26.0–34.0)
MCHC: 32.8 g/dL (ref 30.0–36.0)
MCV: 104.7 fL — ABNORMAL HIGH (ref 80.0–100.0)
Monocytes Absolute: 0.3 10*3/uL (ref 0.1–1.0)
Monocytes Relative: 10 %
Neutro Abs: 1.5 10*3/uL — ABNORMAL LOW (ref 1.7–7.7)
Neutrophils Relative %: 53 %
Platelet Count: 118 10*3/uL — ABNORMAL LOW (ref 150–400)
RBC: 3.82 MIL/uL — ABNORMAL LOW (ref 3.87–5.11)
RDW: 13.5 % (ref 11.5–15.5)
WBC Count: 2.8 10*3/uL — ABNORMAL LOW (ref 4.0–10.5)
nRBC: 0 % (ref 0.0–0.2)

## 2019-09-18 LAB — CMP (CANCER CENTER ONLY)
ALT: 286 U/L (ref 0–44)
AST: 186 U/L (ref 15–41)
Albumin: 3.9 g/dL (ref 3.5–5.0)
Alkaline Phosphatase: 266 U/L — ABNORMAL HIGH (ref 38–126)
Anion gap: 10 (ref 5–15)
BUN: 16 mg/dL (ref 8–23)
CO2: 22 mmol/L (ref 22–32)
Calcium: 10.2 mg/dL (ref 8.9–10.3)
Chloride: 110 mmol/L (ref 98–111)
Creatinine: 1 mg/dL (ref 0.44–1.00)
GFR, Est AFR Am: >60 mL/min (ref >60)
GFR, Estimated: 58 mL/min — ABNORMAL LOW (ref >60)
Glucose, Bld: 83 mg/dL (ref 70–99)
Potassium: 4.4 mmol/L (ref 3.5–5.1)
Sodium: 142 mmol/L (ref 135–145)
Total Bilirubin: 0.5 mg/dL (ref 0.3–1.2)
Total Protein: 7 g/dL (ref 6.5–8.1)

## 2019-09-18 MED ORDER — ANASTROZOLE 1 MG PO TABS: 1.0000 mg | ORAL_TABLET | Freq: Every day | ORAL | 3 refills | Status: DC

## 2019-09-18 NOTE — Assessment & Plan Note (Signed)
Metastatic breast cancer: Referred by Dr. Lebron Quam in [place]. Right breast biopsy on 09/25/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  Patient moved from [place] because she could not tolerate the heat.  PET scan on 05/22/19 showed resolution of the multiple osseous metastatic lesions, right chest wall mass, and bilateral axillary lymphadenopathy.  Current treatment: Abemaciclib, anastrozole and Zometa  Bone metastases: On Zometa monthly.  Zometa has been helping her bone pain as well.  Return to clinic in 1 month with scans done prior to that

## 2019-09-18 NOTE — Telephone Encounter (Signed)
Scheduled appts per 8/12 los. Gave pt a print out of appt calendar.

## 2019-09-18 NOTE — Progress Notes (Signed)
CRITICAL VALUE ALERT  Critical Value:  AST 186 and ALT 286  Date & Time Notied:  8/12/21at 0900  Provider Notified: Nicholas Lose, MD  Orders Received/Actions taken: MD notified, no new orders received at this time.

## 2019-09-25 NOTE — Progress Notes (Signed)
Patient Care Team: System, Pcp Not In as PCP - General  DIAGNOSIS:    ICD-10-CM   1. Metastatic breast cancer ([place])  C50.919     SUMMARY OF ONCOLOGIC HISTORY: Oncology History  Metastatic breast cancer ([place])  09/25/2018 Initial Diagnosis   Right breast biopsy on 09/25/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    05/22/2019 PET scan   PET scan on 05/22/19 showed resolution of the multiple osseous metastatic lesions, right chest wall mass, and bilateral axillary lymphadenopathy.     Anti-estrogen oral therapy   Anastrozole and Zometa.   09/10/2019 Genetic Testing   Negative genetic testing:  No pathogenic variants detected on the [facility] Common Hereditary Cancers panel. The report date is 09/10/2019.   The Common Hereditary Cancers Panel offered by [facility] includes sequencing and/or deletion duplication testing of the following 48 genes: APC, ATM, AXIN2, BARD1, BMPR1A, BRCA1, BRCA2, BRIP1, CDH1, CDK4, CDKN2A (p14ARF), CDKN2A (p16INK4a), CHEK2, CTNNA1, DICER1, EPCAM (Deletion/duplication testing only), GREM1 (promoter region deletion/duplication testing only), KIT, MEN1, MLH1, MSH2, MSH3, MSH6, MUTYH, NBN, NF1, NTHL1, PALB2, PDGFRA, PMS2, POLD1, POLE, PTEN, RAD50, RAD51C, RAD51D, RNF43, SDHB, SDHC, SDHD, SMAD4, SMARCA4. STK11, TP53, TSC1, TSC2, and VHL.  The following genes were evaluated for sequence changes only: SDHA and HOXB13 c.251G>A variant only.      CHIEF COMPLIANT: Follow-up of metastatic breast cancer   INTERVAL HISTORY: Norma Murray is a 69 y.o. with above-mentioned history of metastatic breast cancer currently on treatment with Abemaciclib, anastrozole, and Zometa. She presents to the clinic today for follow-up.    ALLERGIES:  has no allergies on file.  MEDICATIONS:  Current Outpatient Medications  Medication Sig Dispense Refill  . abemaciclib (VERZENIO) 150 MG tablet Take 1 tablet (150 mg total) by mouth 2 (two) times daily. Swallow  tablets whole. Do not chew, crush, or split tablets before swallowing.    . anastrozole (ARIMIDEX) 1 MG tablet Take 1 tablet (1 mg total) by mouth daily. 90 tablet 3  . levothyroxine (SYNTHROID) 75 MCG tablet Take 1 tablet (75 mcg total) by mouth daily before breakfast.     No current facility-administered medications for this visit.    PHYSICAL EXAMINATION: ECOG PERFORMANCE STATUS: 1 - Symptomatic but completely ambulatory  Vitals:   09/26/19 0919  BP: (!) 165/75  Pulse: 72  Resp: 18  Temp: 97.6 F (36.4 C)  SpO2: 100%   Filed Weights   09/26/19 0919  Weight: 134 lb (60.8 kg)    LABORATORY DATA:  I have reviewed the data as listed CMP Latest Ref Rng & Units 09/26/2019 09/18/2019 08/20/2019  Glucose 70 - 99 mg/dL 92 83 88  BUN 8 - 23 mg/dL 13 16 17   Creatinine 0.44 - 1.00 mg/dL 0.79 1.00 1.01(H)  Sodium 135 - 145 mmol/L 142 142 141  Potassium 3.5 - 5.1 mmol/L 4.3 4.4 4.1  Chloride 98 - 111 mmol/L 110 110 110  CO2 22 - 32 mmol/L 23 22 23   Calcium 8.9 - 10.3 mg/dL 9.9 10.2 9.2  Total Protein 6.5 - 8.1 g/dL 6.7 7.0 6.9  Total Bilirubin 0.3 - 1.2 mg/dL 0.6 0.5 0.5  Alkaline Phos 38 - 126 U/L 155(H) 266(H) 117  AST 15 - 41 U/L 44(H) 186(HH) 34  ALT 0 - 44 U/L 69(H) 286(HH) 126(H)    Lab Results  Component Value Date   WBC 2.7 (L) 09/26/2019   HGB 12.3 09/26/2019   HCT 36.0 09/26/2019   MCV 99.7 09/26/2019  PLT 118 (L) 09/26/2019   NEUTROABS 1.1 (L) 09/26/2019    ASSESSMENT & PLAN:  Metastatic breast cancer ([place]) Metastatic breast cancer:Referred by Dr. Lebron Quam in [place]. Right breast biopsy on 09/25/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 Patient moved from [place] because she could not tolerate the heat.  PET scan on 05/22/19 showed resolution of the multiple osseous metastatic lesions, right chest wall mass, and bilateral axillary lymphadenopathy.  Current treatment:Abemaciclib, anastrozole and Zometa  Toxicities: 1.   Elevated LFTs: Today's LFTs showed improvement. Therefore I instructed her to resume abemaciclib. 2. ANC 1.1 3.  Thrombocytopenia: Platelets 118  Bone metastases: On Zometa, will switch her to every 74-monthZometa. I discussed with her that it is very possible that we may be lowering the dosage of abemaciclib in the future 200 mg twice a day if the LFTs continue to go up and her ALittle Chuteremains low.  Return to clinic in 2 weeks with labs and follow-up.   We will plan to obtain PET CT scan in October 2021.    No orders of the defined types were placed in this encounter.  The patient has a good understanding of the overall plan. she agrees with it. she will call with any problems that may develop before the next visit here.  Total time spent: 30 mins including face to face time and time spent for planning, charting and coordination of care  GNicholas Lose MD 09/26/2019  I, MCloyde ReamsDorshimer, am acting as scribe for Dr. VNicholas Lose  I have reviewed the above documentation for accuracy and completeness, and I agree with the above.

## 2019-09-26 ENCOUNTER — Inpatient Hospital Stay: Payer: Medicare PPO | Admitting: Hematology and Oncology

## 2019-09-26 ENCOUNTER — Other Ambulatory Visit: Payer: Self-pay

## 2019-09-26 ENCOUNTER — Telehealth: Payer: Self-pay | Admitting: Hematology and Oncology

## 2019-09-26 ENCOUNTER — Inpatient Hospital Stay: Payer: Medicare PPO

## 2019-09-26 DIAGNOSIS — C50919 Malignant neoplasm of unspecified site of unspecified female breast: Secondary | ICD-10-CM | POA: Diagnosis not present

## 2019-09-26 DIAGNOSIS — C50911 Malignant neoplasm of unspecified site of right female breast: Secondary | ICD-10-CM | POA: Diagnosis not present

## 2019-09-26 LAB — CBC WITH DIFFERENTIAL (CANCER CENTER ONLY)
Abs Immature Granulocytes: 0.01 10*3/uL (ref 0.00–0.07)
Basophils Absolute: 0.1 10*3/uL (ref 0.0–0.1)
Basophils Relative: 3 %
Eosinophils Absolute: 0.1 10*3/uL (ref 0.0–0.5)
Eosinophils Relative: 4 %
HCT: 36 % (ref 36.0–46.0)
Hemoglobin: 12.3 g/dL (ref 12.0–15.0)
Immature Granulocytes: 0 %
Lymphocytes Relative: 35 %
Lymphs Abs: 0.9 10*3/uL (ref 0.7–4.0)
MCH: 34.1 pg — ABNORMAL HIGH (ref 26.0–34.0)
MCHC: 34.2 g/dL (ref 30.0–36.0)
MCV: 99.7 fL (ref 80.0–100.0)
Monocytes Absolute: 0.5 10*3/uL (ref 0.1–1.0)
Monocytes Relative: 19 %
Neutro Abs: 1.1 10*3/uL — ABNORMAL LOW (ref 1.7–7.7)
Neutrophils Relative %: 39 %
Platelet Count: 118 10*3/uL — ABNORMAL LOW (ref 150–400)
RBC: 3.61 MIL/uL — ABNORMAL LOW (ref 3.87–5.11)
RDW: 13.2 % (ref 11.5–15.5)
WBC Count: 2.7 10*3/uL — ABNORMAL LOW (ref 4.0–10.5)
nRBC: 0 % (ref 0.0–0.2)

## 2019-09-26 LAB — CMP (CANCER CENTER ONLY)
ALT: 69 U/L — ABNORMAL HIGH (ref 0–44)
AST: 44 U/L — ABNORMAL HIGH (ref 15–41)
Albumin: 3.9 g/dL (ref 3.5–5.0)
Alkaline Phosphatase: 155 U/L — ABNORMAL HIGH (ref 38–126)
Anion gap: 9 (ref 5–15)
BUN: 13 mg/dL (ref 8–23)
CO2: 23 mmol/L (ref 22–32)
Calcium: 9.9 mg/dL (ref 8.9–10.3)
Chloride: 110 mmol/L (ref 98–111)
Creatinine: 0.79 mg/dL (ref 0.44–1.00)
GFR, Est AFR Am: >60 mL/min (ref >60)
GFR, Estimated: >60 mL/min (ref >60)
Glucose, Bld: 92 mg/dL (ref 70–99)
Potassium: 4.3 mmol/L (ref 3.5–5.1)
Sodium: 142 mmol/L (ref 135–145)
Total Bilirubin: 0.6 mg/dL (ref 0.3–1.2)
Total Protein: 6.7 g/dL (ref 6.5–8.1)

## 2019-09-26 MED ORDER — ANASTROZOLE 1 MG PO TABS
1.0000 mg | ORAL_TABLET | Freq: Every day | ORAL | 3 refills | Status: DC
Start: 2019-09-26 — End: 2019-09-29

## 2019-09-26 NOTE — Telephone Encounter (Signed)
Scheduled appts per 8/20 los. Pt declined print out of AVS and stated she would refer to mychart.

## 2019-09-26 NOTE — Assessment & Plan Note (Signed)
Metastatic breast cancer:Referred by Dr. Lebron Quam in [place]. Right breast biopsy on 09/25/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 Patient moved from [place] because she could not tolerate the heat.  PET scan on 05/22/19 showed resolution of the multiple osseous metastatic lesions, right chest wall mass, and bilateral axillary lymphadenopathy.  Current treatment:Abemaciclib, anastrozole and Zometa  Toxicities: 1.  Elevated LFTs 2. ANC 1.5 3.  Thrombocytopenia: Platelets 118  Bone metastases: On Zometa, will switch her to every 74-monthZometa. Elevated LFTs: We will hold abemaciclib for a week and recheck labs next week.   We will plan to obtain PET CT scan in October 2021.

## 2019-09-29 ENCOUNTER — Other Ambulatory Visit: Payer: Self-pay | Admitting: *Deleted

## 2019-09-29 MED ORDER — ANASTROZOLE 1 MG PO TABS
1.0000 mg | ORAL_TABLET | Freq: Every day | ORAL | 3 refills | Status: DC
Start: 2019-09-29 — End: 2020-09-23

## 2019-10-09 NOTE — Progress Notes (Signed)
Patient Care Team: System, Pcp Not In as PCP - General  DIAGNOSIS:    ICD-10-CM   1. Metastatic breast cancer ([place])  C50.919     SUMMARY OF ONCOLOGIC HISTORY: Oncology History  Metastatic breast cancer ([place])  09/25/2018 Initial Diagnosis   Right breast biopsy on 09/25/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    05/22/2019 PET scan   PET scan on 05/22/19 showed resolution of the multiple osseous metastatic lesions, right chest wall mass, and bilateral axillary lymphadenopathy.     Anti-estrogen oral therapy   Anastrozole and Zometa.   09/10/2019 Genetic Testing   Negative genetic testing:  No pathogenic variants detected on the [facility] Common Hereditary Cancers panel. The report date is 09/10/2019.   The Common Hereditary Cancers Panel offered by [facility] includes sequencing and/or deletion duplication testing of the following 48 genes: APC, ATM, AXIN2, BARD1, BMPR1A, BRCA1, BRCA2, BRIP1, CDH1, CDK4, CDKN2A (p14ARF), CDKN2A (p16INK4a), CHEK2, CTNNA1, DICER1, EPCAM (Deletion/duplication testing only), GREM1 (promoter region deletion/duplication testing only), KIT, MEN1, MLH1, MSH2, MSH3, MSH6, MUTYH, NBN, NF1, NTHL1, PALB2, PDGFRA, PMS2, POLD1, POLE, PTEN, RAD50, RAD51C, RAD51D, RNF43, SDHB, SDHC, SDHD, SMAD4, SMARCA4. STK11, TP53, TSC1, TSC2, and VHL.  The following genes were evaluated for sequence changes only: SDHA and HOXB13 c.251G>A variant only.      CHIEF COMPLIANT: Follow-up of metastatic breast cancer  INTERVAL HISTORY: Norma Murray is a 69 y.o. with above-mentioned history of metastatic breast cancer currently on treatment withAbemaciclib, anastrozole, and Zometa.She presents to the clinic todayfor follow-up.   She is tolerating abemaciclib extremely well.  She does have occasional loose stools.  Denies any nausea or vomiting.  Her energy levels are excellent.  ALLERGIES:  has no allergies on file.  MEDICATIONS:  Current Outpatient  Medications  Medication Sig Dispense Refill  . abemaciclib (VERZENIO) 150 MG tablet Take 1 tablet (150 mg total) by mouth 2 (two) times daily. Swallow tablets whole. Do not chew, crush, or split tablets before swallowing.    . anastrozole (ARIMIDEX) 1 MG tablet Take 1 tablet (1 mg total) by mouth daily. 90 tablet 3  . levothyroxine (SYNTHROID) 75 MCG tablet Take 1 tablet (75 mcg total) by mouth daily before breakfast.     No current facility-administered medications for this visit.    PHYSICAL EXAMINATION: ECOG PERFORMANCE STATUS: 1 - Symptomatic but completely ambulatory  Vitals:   10/10/19 0827  BP: (!) 154/85  Pulse: 80  Resp: 18  Temp: 97.9 F (36.6 C)  SpO2: 100%   Filed Weights   10/10/19 0827  Weight: 134 lb 1.6 oz (60.8 kg)    LABORATORY DATA:  I have reviewed the data as listed CMP Latest Ref Rng & Units 09/26/2019 09/18/2019 08/20/2019  Glucose 70 - 99 mg/dL 92 83 88  BUN 8 - 23 mg/dL _0 Creatinine 0.44 - 1.00 mg/dL 0.79 1.00 1.01(H)  Sodium 135 - 145 mmol/L 142 142 141  Potassium 3.5 - 5.1 mmol/L 4.3 4.4 4.1  Chloride 98 - 111 mmol/L 110 110 110  CO2 22 - 32 mmol/L _1 Calcium 8.9 - 10.3 mg/dL 9.9 10.2 9.2  Total Protein 6.5 - 8.1 g/dL 6.7 7.0 6.9  Total Bilirubin 0.3 - 1.2 mg/dL 0.6 0.5 0.5  Alkaline Phos 38 - 126 U/L 155(H) 266(H) 117  AST 15 - 41 U/L 44(H) 186(HH) 34  ALT 0 - 44 U/L 69(H) 286(HH) 126(H)    Lab Results  Component Value Date  WBC 3.7 (L) 10/10/2019   HGB 12.4 10/10/2019   HCT 36.8 10/10/2019   MCV 101.9 (H) 10/10/2019   PLT 148 (L) 10/10/2019   NEUTROABS 2.2 10/10/2019    ASSESSMENT & PLAN:  Metastatic breast cancer ([place]) Metastatic breast cancer:Referred by Dr. Lebron Quam in [place]. Right breast biopsy on 09/25/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 Patient moved from [place] because she could not tolerate the heat.  PET scan on 05/22/19 showed resolution of the multiple osseous  metastatic lesions, right chest wall mass, and bilateral axillary lymphadenopathy.  Current treatment:Abemaciclib, anastrozole and Zometa  Toxicities: 1.Elevated LFTs: Today's LFTs showed improvement. Therefore I instructed her to resume abemaciclib. 2.leukopenia/neutropenia: ANC improved to 2.2 3.Thrombocytopenia: Platelets improved to 148  Bone metastases: On Zometa, will switch her to every 84-monthZometa.  Return to clinic in 4 weeks with labs and follow-up.   We will plan to obtain PET CT scan in October 2021.  No orders of the defined types were placed in this encounter.  The patient has a good understanding of the overall plan. she agrees with it. she will call with any problems that may develop before the next visit here.  Total time spent: 30 mins including face to face time and time spent for planning, charting and coordination of care  GNicholas Lose MD 10/10/2019  I, MCloyde ReamsDorshimer, am acting as scribe for Dr. VNicholas Lose  I have reviewed the above documentation for accuracy and completeness, and I agree with the above.

## 2019-10-10 ENCOUNTER — Inpatient Hospital Stay: Payer: Medicare PPO

## 2019-10-10 ENCOUNTER — Inpatient Hospital Stay: Payer: Medicare PPO | Attending: Hematology and Oncology | Admitting: Hematology and Oncology

## 2019-10-10 ENCOUNTER — Other Ambulatory Visit: Payer: Self-pay

## 2019-10-10 DIAGNOSIS — R7989 Other specified abnormal findings of blood chemistry: Secondary | ICD-10-CM | POA: Insufficient documentation

## 2019-10-10 DIAGNOSIS — Z170 Estrogen receptor positive status [ER+]: Secondary | ICD-10-CM | POA: Diagnosis not present

## 2019-10-10 DIAGNOSIS — D696 Thrombocytopenia, unspecified: Secondary | ICD-10-CM | POA: Insufficient documentation

## 2019-10-10 DIAGNOSIS — C7951 Secondary malignant neoplasm of bone: Secondary | ICD-10-CM | POA: Insufficient documentation

## 2019-10-10 DIAGNOSIS — D709 Neutropenia, unspecified: Secondary | ICD-10-CM | POA: Diagnosis not present

## 2019-10-10 DIAGNOSIS — C50919 Malignant neoplasm of unspecified site of unspecified female breast: Secondary | ICD-10-CM | POA: Diagnosis not present

## 2019-10-10 DIAGNOSIS — C50911 Malignant neoplasm of unspecified site of right female breast: Secondary | ICD-10-CM | POA: Diagnosis present

## 2019-10-10 DIAGNOSIS — Z79811 Long term (current) use of aromatase inhibitors: Secondary | ICD-10-CM | POA: Insufficient documentation

## 2019-10-10 LAB — CBC WITH DIFFERENTIAL (CANCER CENTER ONLY)
Abs Immature Granulocytes: 0.01 10*3/uL (ref 0.00–0.07)
Basophils Absolute: 0 10*3/uL (ref 0.0–0.1)
Basophils Relative: 1 %
Eosinophils Absolute: 0.2 10*3/uL (ref 0.0–0.5)
Eosinophils Relative: 4 %
HCT: 36.8 % (ref 36.0–46.0)
Hemoglobin: 12.4 g/dL (ref 12.0–15.0)
Immature Granulocytes: 0 %
Lymphocytes Relative: 27 %
Lymphs Abs: 1 10*3/uL (ref 0.7–4.0)
MCH: 34.3 pg — ABNORMAL HIGH (ref 26.0–34.0)
MCHC: 33.7 g/dL (ref 30.0–36.0)
MCV: 101.9 fL — ABNORMAL HIGH (ref 80.0–100.0)
Monocytes Absolute: 0.3 10*3/uL (ref 0.1–1.0)
Monocytes Relative: 9 %
Neutro Abs: 2.2 10*3/uL (ref 1.7–7.7)
Neutrophils Relative %: 59 %
Platelet Count: 148 10*3/uL — ABNORMAL LOW (ref 150–400)
RBC: 3.61 MIL/uL — ABNORMAL LOW (ref 3.87–5.11)
RDW: 13.1 % (ref 11.5–15.5)
WBC Count: 3.7 10*3/uL — ABNORMAL LOW (ref 4.0–10.5)
nRBC: 0 % (ref 0.0–0.2)

## 2019-10-10 LAB — CMP (CANCER CENTER ONLY)
ALT: 29 U/L (ref 0–44)
AST: 23 U/L (ref 15–41)
Albumin: 3.9 g/dL (ref 3.5–5.0)
Alkaline Phosphatase: 105 U/L (ref 38–126)
Anion gap: 10 (ref 5–15)
BUN: 16 mg/dL (ref 8–23)
CO2: 22 mmol/L (ref 22–32)
Calcium: 9 mg/dL (ref 8.9–10.3)
Chloride: 107 mmol/L (ref 98–111)
Creatinine: 1.22 mg/dL — ABNORMAL HIGH (ref 0.44–1.00)
GFR, Est AFR Am: 53 mL/min — ABNORMAL LOW (ref >60)
GFR, Estimated: 45 mL/min — ABNORMAL LOW (ref >60)
Glucose, Bld: 92 mg/dL (ref 70–99)
Potassium: 3.8 mmol/L (ref 3.5–5.1)
Sodium: 139 mmol/L (ref 135–145)
Total Bilirubin: 0.6 mg/dL (ref 0.3–1.2)
Total Protein: 6.6 g/dL (ref 6.5–8.1)

## 2019-10-10 NOTE — Assessment & Plan Note (Signed)
Metastatic breast cancer:Referred by Dr. Lebron Quam in [place]. Right breast biopsy on 09/25/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 Patient moved from [place] because she could not tolerate the heat.  PET scan on 05/22/19 showed resolution of the multiple osseous metastatic lesions, right chest wall mass, and bilateral axillary lymphadenopathy.  Current treatment:Abemaciclib, anastrozole and Zometa  Toxicities: 1.Elevated LFTs: Today's LFTs showed improvement. Therefore I instructed her to resume abemaciclib. 2.ANC 1.1 3.Thrombocytopenia: Platelets 118  Bone metastases: On Zometa, will switch her to every 15-monthZometa. I discussed with her that it is very possible that we may be lowering the dosage of abemaciclib in the future 200 mg twice a day if the LFTs continue to go up and her ACrestlineremains low.  Return to clinic in 2 weeks with labs and follow-up.   We will plan to obtain PET CT scan in October 2021.

## 2019-10-16 ENCOUNTER — Ambulatory Visit: Payer: Medicare PPO

## 2019-10-16 ENCOUNTER — Other Ambulatory Visit: Payer: Medicare PPO

## 2019-11-10 ENCOUNTER — Ambulatory Visit (HOSPITAL_COMMUNITY)
Admission: RE | Admit: 2019-11-10 | Discharge: 2019-11-10 | Disposition: A | Discharge status: Home / Self Care | Payer: Medicare PPO | Source: Ambulatory Visit | Attending: Hematology and Oncology | Admitting: Hematology and Oncology

## 2019-11-10 ENCOUNTER — Other Ambulatory Visit: Payer: Self-pay

## 2019-11-10 DIAGNOSIS — C50919 Malignant neoplasm of unspecified site of unspecified female breast: Secondary | ICD-10-CM | POA: Diagnosis present

## 2019-11-10 DIAGNOSIS — C7951 Secondary malignant neoplasm of bone: Secondary | ICD-10-CM | POA: Insufficient documentation

## 2019-11-10 LAB — GLUCOSE, CAPILLARY: Glucose-Capillary: 87 mg/dL (ref 70–99)

## 2019-11-10 MED ORDER — FLUDEOXYGLUCOSE F - 18 (FDG) INJECTION
6.7000 | Freq: Once | INTRAVENOUS | Status: AC | PRN
  Administered 2019-11-10: 6.7 via INTRAVENOUS

## 2019-11-10 NOTE — Progress Notes (Signed)
Patient Care Team: Patient, [name] as PCP - General (General Practice)  DIAGNOSIS:    ICD-10-CM   1. Metastatic breast cancer ([place])  C50.919     SUMMARY OF ONCOLOGIC HISTORY: Oncology History  Metastatic breast cancer ([place])  09/25/2018 Initial Diagnosis   Right breast biopsy on 09/25/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    05/22/2019 PET scan   PET scan on 05/22/19 showed resolution of the multiple osseous metastatic lesions, right chest wall mass, and bilateral axillary lymphadenopathy.     Anti-estrogen oral therapy   Anastrozole and Zometa.   09/10/2019 Genetic Testing   Negative genetic testing:  No pathogenic variants detected on the [facility] Common Hereditary Cancers panel. The report date is 09/10/2019.   The Common Hereditary Cancers Panel offered by [facility] includes sequencing and/or deletion duplication testing of the following 48 genes: APC, ATM, AXIN2, BARD1, BMPR1A, BRCA1, BRCA2, BRIP1, CDH1, CDK4, CDKN2A (p14ARF), CDKN2A (p16INK4a), CHEK2, CTNNA1, DICER1, EPCAM (Deletion/duplication testing only), GREM1 (promoter region deletion/duplication testing only), KIT, MEN1, MLH1, MSH2, MSH3, MSH6, MUTYH, NBN, NF1, NTHL1, PALB2, PDGFRA, PMS2, POLD1, POLE, PTEN, RAD50, RAD51C, RAD51D, RNF43, SDHB, SDHC, SDHD, SMAD4, SMARCA4. STK11, TP53, TSC1, TSC2, and VHL.  The following genes were evaluated for sequence changes only: SDHA and HOXB13 c.251G>A variant only.      CHIEF COMPLIANT: Follow-up of metastatic breast cancer  INTERVAL HISTORY: Norma Murray is a 69 y.o. with above-mentioned history of metastatic breast cancer currently on treatment withAbemaciclib, anastrozole, and Zometa. PET scan on 11/10/19 showed no recurrent or new metastatic cancer and diffuse treated osseous metastases. She presents to the clinic todayfor follow-up.   ALLERGIES:  has no allergies on file.  MEDICATIONS:  Current Outpatient Medications  Medication Sig  Dispense Refill  . abemaciclib (VERZENIO) 150 MG tablet Take 1 tablet (150 mg total) by mouth 2 (two) times daily. Swallow tablets whole. Do not chew, crush, or split tablets before swallowing.    . anastrozole (ARIMIDEX) 1 MG tablet Take 1 tablet (1 mg total) by mouth daily. 90 tablet 3  . levothyroxine (SYNTHROID) 75 MCG tablet Take 1 tablet (75 mcg total) by mouth daily before breakfast.     No current facility-administered medications for this visit.    PHYSICAL EXAMINATION: ECOG PERFORMANCE STATUS: 1 - Symptomatic but completely ambulatory  Vitals:   11/11/19 0813  BP: (!) 149/82  Pulse: 69  Resp: 17  Temp: 98.4 F (36.9 C)  SpO2: 99%   Filed Weights   11/11/19 0813  Weight: 133 lb 8 oz (60.6 kg)    LABORATORY DATA:  I have reviewed the data as listed CMP Latest Ref Rng & Units 10/10/2019 09/26/2019 09/18/2019  Glucose 70 - 99 mg/dL 92 92 83  BUN 8 - 23 mg/dL 16 13 16   Creatinine 0.44 - 1.00 mg/dL 1.22(H) 0.79 1.00  Sodium 135 - 145 mmol/L 139 142 142  Potassium 3.5 - 5.1 mmol/L 3.8 4.3 4.4  Chloride 98 - 111 mmol/L 107 110 110  CO2 22 - 32 mmol/L 22 23 22   Calcium 8.9 - 10.3 mg/dL 9.0 9.9 10.2  Total Protein 6.5 - 8.1 g/dL 6.6 6.7 7.0  Total Bilirubin 0.3 - 1.2 mg/dL 0.6 0.6 0.5  Alkaline Phos 38 - 126 U/L 105 155(H) 266(H)  AST 15 - 41 U/L 23 44(H) 186(HH)  ALT 0 - 44 U/L 29 69(H) 286(HH)    Lab Results  Component Value Date   WBC 3.2 (L) 11/11/2019  HGB 13.2 11/11/2019   HCT 39.7 11/11/2019   MCV 103.7 (H) 11/11/2019   PLT 125 (L) 11/11/2019   NEUTROABS 1.7 11/11/2019    ASSESSMENT & PLAN:  Metastatic breast cancer ([place]) Metastatic breast cancer:Referred by Dr. Lebron Quam in [place]. Right breast biopsy on 09/25/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 Patient moved from [place] because she could not tolerate the heat.  PET scan on 05/22/19 showed resolution of the multiple osseous metastatic lesions, right chest wall  mass, and bilateral axillary lymphadenopathy.  Current treatment:Abemaciclib, anastrozole and Zometa  Toxicities: 1.Elevated LFTs: Because the liver enzymes have increased to more than 5 times upper limit of normal, we will hold the treatment and see her back in 2 weeks.  If her labs are better then she will start back on Verzenio at a lower dosage of 100 mg p.o. twice daily.  I sent a new prescription to her pharmacy and they will assist her in getting the medication. 2.leukopenia/neutropenia: ANC 1.7 3.Thrombocytopenia: Platelets  125  Bone metastases: On Zometa, will switch her to every 65-monthZometa. PET CT scan 11/10/2019: No evidence of recurrent breast cancer.  No evidence of metastatic disease in the chest abdomen pelvis.  Evidence of diffuse treated bone metastatic disease without hypermetabolism.  Return to clinic in 2 weeks for labs and follow-up.  She will get Zometa at that time.     No orders of the defined types were placed in this encounter.  The patient has a good understanding of the overall plan. she agrees with it. she will call with any problems that may develop before the next visit here.  Total time spent: 30 mins including face to face time and time spent for planning, charting and coordination of care  GNicholas Lose MD 11/11/2019  I, MCloyde ReamsDorshimer, am acting as scribe for Dr. VNicholas Lose  I have reviewed the above documentation for accuracy and completeness, and I agree with the above.

## 2019-11-11 ENCOUNTER — Inpatient Hospital Stay: Payer: Medicare PPO

## 2019-11-11 ENCOUNTER — Inpatient Hospital Stay: Payer: Medicare PPO | Attending: Hematology and Oncology | Admitting: Hematology and Oncology

## 2019-11-11 ENCOUNTER — Telehealth: Payer: Self-pay | Admitting: Hematology and Oncology

## 2019-11-11 ENCOUNTER — Other Ambulatory Visit: Payer: Self-pay

## 2019-11-11 ENCOUNTER — Telehealth: Payer: Self-pay | Admitting: Pharmacist

## 2019-11-11 DIAGNOSIS — C50919 Malignant neoplasm of unspecified site of unspecified female breast: Secondary | ICD-10-CM

## 2019-11-11 DIAGNOSIS — C7951 Secondary malignant neoplasm of bone: Secondary | ICD-10-CM | POA: Diagnosis present

## 2019-11-11 DIAGNOSIS — R7989 Other specified abnormal findings of blood chemistry: Secondary | ICD-10-CM | POA: Insufficient documentation

## 2019-11-11 DIAGNOSIS — Z79811 Long term (current) use of aromatase inhibitors: Secondary | ICD-10-CM | POA: Diagnosis not present

## 2019-11-11 DIAGNOSIS — D696 Thrombocytopenia, unspecified: Secondary | ICD-10-CM | POA: Diagnosis not present

## 2019-11-11 DIAGNOSIS — Z79899 Other long term (current) drug therapy: Secondary | ICD-10-CM | POA: Diagnosis not present

## 2019-11-11 DIAGNOSIS — C50911 Malignant neoplasm of unspecified site of right female breast: Secondary | ICD-10-CM | POA: Insufficient documentation

## 2019-11-11 DIAGNOSIS — D72819 Decreased white blood cell count, unspecified: Secondary | ICD-10-CM | POA: Insufficient documentation

## 2019-11-11 DIAGNOSIS — Z170 Estrogen receptor positive status [ER+]: Secondary | ICD-10-CM | POA: Diagnosis not present

## 2019-11-11 LAB — CBC WITH DIFFERENTIAL (CANCER CENTER ONLY)
Abs Immature Granulocytes: 0.01 10*3/uL (ref 0.00–0.07)
Basophils Absolute: 0 10*3/uL (ref 0.0–0.1)
Basophils Relative: 1 %
Eosinophils Absolute: 0.1 10*3/uL (ref 0.0–0.5)
Eosinophils Relative: 3 %
HCT: 39.7 % (ref 36.0–46.0)
Hemoglobin: 13.2 g/dL (ref 12.0–15.0)
Immature Granulocytes: 0 %
Lymphocytes Relative: 31 %
Lymphs Abs: 1 10*3/uL (ref 0.7–4.0)
MCH: 34.5 pg — ABNORMAL HIGH (ref 26.0–34.0)
MCHC: 33.2 g/dL (ref 30.0–36.0)
MCV: 103.7 fL — ABNORMAL HIGH (ref 80.0–100.0)
Monocytes Absolute: 0.3 10*3/uL (ref 0.1–1.0)
Monocytes Relative: 10 %
Neutro Abs: 1.7 10*3/uL (ref 1.7–7.7)
Neutrophils Relative %: 55 %
Platelet Count: 125 10*3/uL — ABNORMAL LOW (ref 150–400)
RBC: 3.83 MIL/uL — ABNORMAL LOW (ref 3.87–5.11)
RDW: 13.3 % (ref 11.5–15.5)
WBC Count: 3.2 10*3/uL — ABNORMAL LOW (ref 4.0–10.5)
nRBC: 0 % (ref 0.0–0.2)

## 2019-11-11 LAB — CMP (CANCER CENTER ONLY)
ALT: 226 U/L — ABNORMAL HIGH (ref 0–44)
AST: 82 U/L — ABNORMAL HIGH (ref 15–41)
Albumin: 3.8 g/dL (ref 3.5–5.0)
Alkaline Phosphatase: 142 U/L — ABNORMAL HIGH (ref 38–126)
Anion gap: 5 (ref 5–15)
BUN: 17 mg/dL (ref 8–23)
CO2: 26 mmol/L (ref 22–32)
Calcium: 9.7 mg/dL (ref 8.9–10.3)
Chloride: 109 mmol/L (ref 98–111)
Creatinine: 1.03 mg/dL — ABNORMAL HIGH (ref 0.44–1.00)
GFR, Estimated: 56 mL/min — ABNORMAL LOW (ref >60)
Glucose, Bld: 89 mg/dL (ref 70–99)
Potassium: 4 mmol/L (ref 3.5–5.1)
Sodium: 140 mmol/L (ref 135–145)
Total Bilirubin: 0.6 mg/dL (ref 0.3–1.2)
Total Protein: 6.6 g/dL (ref 6.5–8.1)

## 2019-11-11 MED ORDER — ABEMACICLIB 100 MG PO TABS: 100.0000 mg | ORAL_TABLET | Freq: Two times a day (BID) | ORAL | 6 refills | Status: DC

## 2019-11-11 MED ORDER — ABEMACICLIB 100 MG PO TABS: 100.0000 mg | ORAL_TABLET | Freq: Two times a day (BID) | ORAL | Status: DC

## 2019-11-11 NOTE — Telephone Encounter (Signed)
Oral Chemotherapy Pharmacist Encounter   Notified by Dr. Lindi Adie that patient is enrolled in manufacturer assistance for [facility]. Called dispensing pharmacy ([facility], phone # [phone]) and confirmed that they had patient's most updated address listed in Epic.  Redirected new Verzenio Rx to dispensing pharmacy. Patient has also been added to pharmacy follow-up to help for re-enrollment of patient assistance for Verzenio prior to 02/06/20.  Spoke with Norma Murray and updated her on the above information. She expressed understanding and appreciation. Provided patient with the Oral Chemotherapy clinic number as well if she has any questions or concerns that we can assist with.   Norma Murray, PharmD, BCPS Hematology/Oncology Clinical Pharmacist [facility] [phone] 11/11/2019 11:11 AM

## 2019-11-11 NOTE — Telephone Encounter (Signed)
Scheduled appointments per 10/5 los. Patient is aware of appointments. Gave patient updated calendar.  

## 2019-11-11 NOTE — Assessment & Plan Note (Addendum)
Metastatic breast cancer:Referred by Dr. Lebron Quam in [place]. Right breast biopsy on 09/25/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 Patient moved from [place] because she could not tolerate the heat.  PET scan on 05/22/19 showed resolution of the multiple osseous metastatic lesions, right chest wall mass, and bilateral axillary lymphadenopathy.  Current treatment:Abemaciclib, anastrozole and Zometa  Toxicities: 1.Elevated LFTs: Today's LFTs showed improvement. Therefore I instructed her to resume abemaciclib. 2.leukopenia/neutropenia: ANC improved to 2.2 3.Thrombocytopenia: Platelets improved to 148  Bone metastases: On Zometa, will switch her to every 31-monthZometa. PET CT scan 11/10/2019: No evidence of recurrent breast cancer.  No evidence of metastatic disease in the chest abdomen pelvis.  Evidence of diffuse treated bone metastatic disease without hypermetabolism.  Return to clinic in 3 months with labs and follow-up and for Zometa

## 2019-11-13 ENCOUNTER — Inpatient Hospital Stay: Payer: Medicare PPO

## 2019-11-23 NOTE — Progress Notes (Signed)
Patient Care Team: Patient, [name] as PCP - General (General Practice)  DIAGNOSIS:    ICD-10-CM   1. Metastatic breast cancer ([place])  C50.919     SUMMARY OF ONCOLOGIC HISTORY: Oncology History  Metastatic breast cancer ([place])  09/25/2018 Initial Diagnosis   Right breast biopsy on 09/25/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    05/22/2019 PET scan   PET scan on 05/22/19 showed resolution of the multiple osseous metastatic lesions, right chest wall mass, and bilateral axillary lymphadenopathy.     Anti-estrogen oral therapy   Abemaciclib, Anastrozole and Zometa.  (Moved from [place])   09/10/2019 Genetic Testing   Negative genetic testing:  No pathogenic variants detected on the [facility] Common Hereditary Cancers panel. The report date is 09/10/2019.   The Common Hereditary Cancers Panel offered by [facility] includes sequencing and/or deletion duplication testing of the following 48 genes: APC, ATM, AXIN2, BARD1, BMPR1A, BRCA1, BRCA2, BRIP1, CDH1, CDK4, CDKN2A (p14ARF), CDKN2A (p16INK4a), CHEK2, CTNNA1, DICER1, EPCAM (Deletion/duplication testing only), GREM1 (promoter region deletion/duplication testing only), KIT, MEN1, MLH1, MSH2, MSH3, MSH6, MUTYH, NBN, NF1, NTHL1, PALB2, PDGFRA, PMS2, POLD1, POLE, PTEN, RAD50, RAD51C, RAD51D, RNF43, SDHB, SDHC, SDHD, SMAD4, SMARCA4. STK11, TP53, TSC1, TSC2, and VHL.  The following genes were evaluated for sequence changes only: SDHA and HOXB13 c.251G>A variant only.      CHIEF COMPLIANT: Follow-up of metastatic breast cancer  INTERVAL HISTORY: Norma Murray is a 69 y.o. with above-mentioned history of metastatic breast cancer currently on treatment withAbemaciclib, anastrozole, and Zometa. She presents to the clinic todayfor follow-up and treatment.  She is anxious to get started back on abemaciclib.  She does feel stiff in her hands especially in the mornings probably because of anastrozole therapy.  ALLERGIES:   has no allergies on file.  MEDICATIONS:  Current Outpatient Medications  Medication Sig Dispense Refill  . abemaciclib (VERZENIO) 100 MG tablet Take 1 tablet (100 mg total) by mouth 2 (two) times daily. Swallow tablets whole. Do not chew, crush, or split tablets before swallowing. 56 tablet 6  . anastrozole (ARIMIDEX) 1 MG tablet Take 1 tablet (1 mg total) by mouth daily. 90 tablet 3  . levothyroxine (SYNTHROID) 75 MCG tablet Take 1 tablet (75 mcg total) by mouth daily before breakfast.     No current facility-administered medications for this visit.    PHYSICAL EXAMINATION: ECOG PERFORMANCE STATUS: 1 - Symptomatic but completely ambulatory  There were no vitals filed for this visit. There were no vitals filed for this visit.  LABORATORY DATA:  I have reviewed the data as listed CMP Latest Ref Rng & Units 11/24/2019 11/11/2019 10/10/2019  Glucose 70 - 99 mg/dL 104(H) 89 92  BUN 8 - 23 mg/dL 15 17 16   Creatinine 0.44 - 1.00 mg/dL 0.81 1.03(H) 1.22(H)  Sodium 135 - 145 mmol/L 139 140 139  Potassium 3.5 - 5.1 mmol/L 4.1 4.0 3.8  Chloride 98 - 111 mmol/L 110 109 107  CO2 22 - 32 mmol/L 23 26 22   Calcium 8.9 - 10.3 mg/dL 9.3 9.7 9.0  Total Protein 6.5 - 8.1 g/dL 6.7 6.6 6.6  Total Bilirubin 0.3 - 1.2 mg/dL 0.5 0.6 0.6  Alkaline Phos 38 - 126 U/L 108 142(H) 105  AST 15 - 41 U/L 55(H) 82(H) 23  ALT 0 - 44 U/L 73(H) 226(H) 29    Lab Results  Component Value Date   WBC 3.8 (L) 11/24/2019   HGB 13.1 11/24/2019   HCT 39.8  11/24/2019   MCV 101.3 (H) 11/24/2019   PLT 148 (L) 11/24/2019   NEUTROABS 2.1 11/24/2019    ASSESSMENT & PLAN:  Metastatic breast cancer [facility]) Metastatic breast cancer:Referred by Dr. Lebron Quam in [place]. Right breast biopsy on 09/25/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 Patient moved from [place] because she could not tolerate the heat.  PET scan on 05/22/19 showed resolution of the multiple osseous metastatic lesions,  right chest wall mass, and bilateral axillary lymphadenopathy.  Current treatment:Abemaciclib, anastrozole and Zometa  Toxicities: 1.Elevated LFTs: Because the liver enzymes have increased to more than 5 times upper limit of normal, we will hold the treatment and see her back in 2 weeks.  If her labs are better then she will start back on Verzenio at a lower dosage of 100 mg p.o. twice daily.  I sent a new prescription to her pharmacy and they will assist her in getting the medication. 2.leukopenia/neutropenia: ANC 1.7 3.Thrombocytopenia: Platelets 125  Bone metastases: On Zometa, will switch her to every 68-monthZometa. PET CT scan 11/10/2019: No evidence of recurrent breast cancer.  No evidence of metastatic disease in the chest abdomen pelvis.  Evidence of diffuse treated bone metastatic disease without hypermetabolism.  Return to clinic in 4 weeks for labs and follow-up.    No orders of the defined types were placed in this encounter.  The patient has a good understanding of the overall plan. she agrees with it. she will call with any problems that may develop before the next visit here.  Total time spent: 30 mins including face to face time and time spent for planning, charting and coordination of care  GNicholas Lose MD 11/24/2019  I, MCloyde ReamsDorshimer, am acting as scribe for Dr. VNicholas Lose  I have reviewed the above documentation for accuracy and completeness, and I agree with the above.

## 2019-11-24 ENCOUNTER — Inpatient Hospital Stay: Payer: Medicare PPO

## 2019-11-24 ENCOUNTER — Inpatient Hospital Stay: Payer: Medicare PPO | Admitting: Hematology and Oncology

## 2019-11-24 ENCOUNTER — Other Ambulatory Visit: Payer: Self-pay

## 2019-11-24 DIAGNOSIS — C50919 Malignant neoplasm of unspecified site of unspecified female breast: Secondary | ICD-10-CM

## 2019-11-24 DIAGNOSIS — C7951 Secondary malignant neoplasm of bone: Secondary | ICD-10-CM | POA: Diagnosis not present

## 2019-11-24 LAB — CBC WITH DIFFERENTIAL (CANCER CENTER ONLY)
Abs Immature Granulocytes: 0.04 10*3/uL (ref 0.00–0.07)
Basophils Absolute: 0.1 10*3/uL (ref 0.0–0.1)
Basophils Relative: 2 %
Eosinophils Absolute: 0.1 10*3/uL (ref 0.0–0.5)
Eosinophils Relative: 2 %
HCT: 39.8 % (ref 36.0–46.0)
Hemoglobin: 13.1 g/dL (ref 12.0–15.0)
Immature Granulocytes: 1 %
Lymphocytes Relative: 26 %
Lymphs Abs: 1 10*3/uL (ref 0.7–4.0)
MCH: 33.3 pg (ref 26.0–34.0)
MCHC: 32.9 g/dL (ref 30.0–36.0)
MCV: 101.3 fL — ABNORMAL HIGH (ref 80.0–100.0)
Monocytes Absolute: 0.5 10*3/uL (ref 0.1–1.0)
Monocytes Relative: 14 %
Neutro Abs: 2.1 10*3/uL (ref 1.7–7.7)
Neutrophils Relative %: 55 %
Platelet Count: 148 10*3/uL — ABNORMAL LOW (ref 150–400)
RBC: 3.93 MIL/uL (ref 3.87–5.11)
RDW: 12.4 % (ref 11.5–15.5)
WBC Count: 3.8 10*3/uL — ABNORMAL LOW (ref 4.0–10.5)
nRBC: 0 % (ref 0.0–0.2)

## 2019-11-24 LAB — CMP (CANCER CENTER ONLY)
ALT: 73 U/L — ABNORMAL HIGH (ref 0–44)
AST: 55 U/L — ABNORMAL HIGH (ref 15–41)
Albumin: 3.9 g/dL (ref 3.5–5.0)
Alkaline Phosphatase: 108 U/L (ref 38–126)
Anion gap: 6 (ref 5–15)
BUN: 15 mg/dL (ref 8–23)
CO2: 23 mmol/L (ref 22–32)
Calcium: 9.3 mg/dL (ref 8.9–10.3)
Chloride: 110 mmol/L (ref 98–111)
Creatinine: 0.81 mg/dL (ref 0.44–1.00)
GFR, Estimated: >60 mL/min (ref >60)
Glucose, Bld: 104 mg/dL — ABNORMAL HIGH (ref 70–99)
Potassium: 4.1 mmol/L (ref 3.5–5.1)
Sodium: 139 mmol/L (ref 135–145)
Total Bilirubin: 0.5 mg/dL (ref 0.3–1.2)
Total Protein: 6.7 g/dL (ref 6.5–8.1)

## 2019-11-24 MED ORDER — SODIUM CHLORIDE 0.9 % IV SOLN
Freq: Once | INTRAVENOUS | Status: AC
  Filled 2019-11-24: qty 250

## 2019-11-24 MED ORDER — ZOLEDRONIC ACID 4 MG/100ML IV SOLN
INTRAVENOUS | Status: AC
  Filled 2019-11-24: qty 100

## 2019-11-24 MED ORDER — ZOLEDRONIC ACID 4 MG/100ML IV SOLN
4.0000 mg | Freq: Once | INTRAVENOUS | Status: AC
  Administered 2019-11-24: 4 mg via INTRAVENOUS

## 2019-11-24 NOTE — Patient Instructions (Signed)
Zoledronic Acid injection (Hypercalcemia, Oncology) What is this medicine? ZOLEDRONIC ACID (ZOE le dron ik AS id) lowers the amount of calcium loss from bone. It is used to treat too much calcium in your blood from cancer. It is also used to prevent complications of cancer that has spread to the bone. This medicine may be used for other purposes; ask your health care provider or pharmacist if you have questions. COMMON BRAND NAME(S): Zometa What should I tell my health care provider before I take this medicine? They need to know if you have any of these conditions:  aspirin-sensitive asthma  cancer, especially if you are receiving medicines used to treat cancer  dental disease or wear dentures  infection  kidney disease  receiving corticosteroids like dexamethasone or prednisone  an unusual or allergic reaction to zoledronic acid, other medicines, foods, dyes, or preservatives  pregnant or trying to get pregnant  breast-feeding How should I use this medicine? This medicine is for infusion into a vein. It is given by a health care professional in a hospital or clinic setting. Talk to your pediatrician regarding the use of this medicine in children. Special care may be needed. Overdosage: If you think you have taken too much of this medicine contact a poison control center or emergency room at once. NOTE: This medicine is only for you. Do not share this medicine with others. What if I miss a dose? It is important not to miss your dose. Call your doctor or health care professional if you are unable to keep an appointment. What may interact with this medicine?  certain antibiotics given by injection  NSAIDs, medicines for pain and inflammation, like ibuprofen or naproxen  some diuretics like bumetanide, furosemide  teriparatide  thalidomide This list may not describe all possible interactions. Give your health care provider a list of all the medicines, herbs, non-prescription  drugs, or dietary supplements you use. Also tell them if you smoke, drink alcohol, or use illegal drugs. Some items may interact with your medicine. What should I watch for while using this medicine? Visit your doctor or health care professional for regular checkups. It may be some time before you see the benefit from this medicine. Do not stop taking your medicine unless your doctor tells you to. Your doctor may order blood tests or other tests to see how you are doing. Women should inform their doctor if they wish to become pregnant or think they might be pregnant. There is a potential for serious side effects to an unborn child. Talk to your health care professional or pharmacist for more information. You should make sure that you get enough calcium and vitamin D while you are taking this medicine. Discuss the foods you eat and the vitamins you take with your health care professional. Some people who take this medicine have severe bone, joint, and/or muscle pain. This medicine may also increase your risk for jaw problems or a broken thigh bone. Tell your doctor right away if you have severe pain in your jaw, bones, joints, or muscles. Tell your doctor if you have any pain that does not go away or that gets worse. Tell your dentist and dental surgeon that you are taking this medicine. You should not have major dental surgery while on this medicine. See your dentist to have a dental exam and fix any dental problems before starting this medicine. Take good care of your teeth while on this medicine. Make sure you see your dentist for regular follow-up   appointments. What side effects may I notice from receiving this medicine? Side effects that you should report to your doctor or health care professional as soon as possible:  allergic reactions like skin rash, itching or hives, swelling of the face, lips, or tongue  anxiety, confusion, or depression  breathing problems  changes in vision  eye  pain  feeling faint or lightheaded, falls  jaw pain, especially after dental work  mouth sores  muscle cramps, stiffness, or weakness  redness, blistering, peeling or loosening of the skin, including inside the mouth  trouble passing urine or change in the amount of urine Side effects that usually do not require medical attention (report to your doctor or health care professional if they continue or are bothersome):  bone, joint, or muscle pain  constipation  diarrhea  fever  hair loss  irritation at site where injected  loss of appetite  nausea, vomiting  stomach upset  trouble sleeping  trouble swallowing  weak or tired This list may not describe all possible side effects. Call your doctor for medical advice about side effects. You may report side effects to FDA at 1-800-FDA-1088. Where should I keep my medicine? This drug is given in a hospital or clinic and will not be stored at home. NOTE: This sheet is a summary. It may not cover all possible information. If you have questions about this medicine, talk to your doctor, pharmacist, or health care provider.  2020 Elsevier/Gold Standard (2013-06-21 14:19:39)  

## 2019-11-24 NOTE — Assessment & Plan Note (Signed)
Metastatic breast cancer:Referred by Dr. Lebron Quam in [place]. Right breast biopsy on 09/25/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 Patient moved from [place] because she could not tolerate the heat.  PET scan on 05/22/19 showed resolution of the multiple osseous metastatic lesions, right chest wall mass, and bilateral axillary lymphadenopathy.  Current treatment:Abemaciclib, anastrozole and Zometa  Toxicities: 1.Elevated LFTs: Because the liver enzymes have increased to more than 5 times upper limit of normal, we will hold the treatment and see her back in 2 weeks.  If her labs are better then she will start back on Verzenio at a lower dosage of 100 mg p.o. twice daily.  I sent a new prescription to her pharmacy and they will assist her in getting the medication. 2.leukopenia/neutropenia: ANC 1.7 3.Thrombocytopenia: Platelets 125  Bone metastases: On Zometa, will switch her to every 50-monthZometa. PET CT scan 11/10/2019: No evidence of recurrent breast cancer.  No evidence of metastatic disease in the chest abdomen pelvis.  Evidence of diffuse treated bone metastatic disease without hypermetabolism.  Return to clinic in 4 weeks for labs and follow-up.

## 2019-12-21 NOTE — Progress Notes (Signed)
Patient Care Team: Patient, [name] as PCP - General (General Practice)  DIAGNOSIS:    ICD-10-CM   1. Metastatic breast cancer ([place])  C50.919     SUMMARY OF ONCOLOGIC HISTORY: Oncology History  Metastatic breast cancer ([place])  09/25/2018 Initial Diagnosis   Right breast biopsy on 09/25/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    05/22/2019 PET scan   PET scan on 05/22/19 showed resolution of the multiple osseous metastatic lesions, right chest wall mass, and bilateral axillary lymphadenopathy.     Anti-estrogen oral therapy   Abemaciclib, Anastrozole and Zometa.  (Moved from [place])   09/10/2019 Genetic Testing   Negative genetic testing:  No pathogenic variants detected on the [facility] Common Hereditary Cancers panel. The report date is 09/10/2019.   The Common Hereditary Cancers Panel offered by [facility] includes sequencing and/or deletion duplication testing of the following 48 genes: APC, ATM, AXIN2, BARD1, BMPR1A, BRCA1, BRCA2, BRIP1, CDH1, CDK4, CDKN2A (p14ARF), CDKN2A (p16INK4a), CHEK2, CTNNA1, DICER1, EPCAM (Deletion/duplication testing only), GREM1 (promoter region deletion/duplication testing only), KIT, MEN1, MLH1, MSH2, MSH3, MSH6, MUTYH, NBN, NF1, NTHL1, PALB2, PDGFRA, PMS2, POLD1, POLE, PTEN, RAD50, RAD51C, RAD51D, RNF43, SDHB, SDHC, SDHD, SMAD4, SMARCA4. STK11, TP53, TSC1, TSC2, and VHL.  The following genes were evaluated for sequence changes only: SDHA and HOXB13 c.251G>A variant only.      CHIEF COMPLIANT: Follow-up of metastatic breast cancer  INTERVAL HISTORY: Norma Murray is a 69 y.o. with above-mentioned history of metastatic breast cancer currently on treatment withAbemaciclib, anastrozole, and Zometa.She presents to the clinic todayfor follow-up and treatment.   We will reduce the dosage of Verzenio and she is tolerating it extremely well.  ALLERGIES:  has No Known Allergies.  MEDICATIONS:  Current Outpatient Medications   Medication Sig Dispense Refill  . abemaciclib (VERZENIO) 100 MG tablet Take 1 tablet (100 mg total) by mouth 2 (two) times daily. Swallow tablets whole. Do not chew, crush, or split tablets before swallowing. 56 tablet 6  . anastrozole (ARIMIDEX) 1 MG tablet Take 1 tablet (1 mg total) by mouth daily. 90 tablet 3  . levothyroxine (SYNTHROID) 75 MCG tablet Take 1 tablet (75 mcg total) by mouth daily before breakfast.     No current facility-administered medications for this visit.    PHYSICAL EXAMINATION: ECOG PERFORMANCE STATUS: 1 - Symptomatic but completely ambulatory  Vitals:   12/22/19 0803  BP: (!) 143/80  Pulse: 72  Resp: 18  Temp: 99.2 F (37.3 C)  SpO2: 99%   Filed Weights   12/22/19 0803  Weight: 137 lb 4.8 oz (62.3 kg)    LABORATORY DATA:  I have reviewed the data as listed CMP Latest Ref Rng & Units 12/22/2019 11/24/2019 11/11/2019  Glucose 70 - 99 mg/dL 88 104(H) 89  BUN 8 - 23 mg/dL 18 15 17   Creatinine 0.44 - 1.00 mg/dL 0.99 0.81 1.03(H)  Sodium 135 - 145 mmol/L 142 139 140  Potassium 3.5 - 5.1 mmol/L 4.2 4.1 4.0  Chloride 98 - 111 mmol/L 109 110 109  CO2 22 - 32 mmol/L 25 23 26   Calcium 8.9 - 10.3 mg/dL 9.1 9.3 9.7  Total Protein 6.5 - 8.1 g/dL 6.6 6.7 6.6  Total Bilirubin 0.3 - 1.2 mg/dL 0.7 0.5 0.6  Alkaline Phos 38 - 126 U/L 74 108 142(H)  AST 15 - 41 U/L 26 55(H) 82(H)  ALT 0 - 44 U/L 32 73(H) 226(H)    Lab Results  Component Value Date  WBC 3.7 (L) 12/22/2019   HGB 13.1 12/22/2019   HCT 39.3 12/22/2019   MCV 100.8 (H) 12/22/2019   PLT 139 (L) 12/22/2019   NEUTROABS 2.0 12/22/2019    ASSESSMENT & PLAN:  Metastatic breast cancer ([place]) Metastatic breast cancer:Referred by Dr. Lebron Quam in [place]. Right breast biopsy on 09/25/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 Patient moved from [place] because she could not tolerate the heat.  PET scan on 05/22/19 showed resolution of the multiple osseous metastatic  lesions, right chest wall mass, and bilateral axillary lymphadenopathy.  Current treatment:Abemaciclib, anastrozole and Zometa  Toxicities: 1.Elevated LFTs:We reduced the dosage to 100 mg p.o. twice daily.  She is tolerating it extremely well with normal LFTs. 2.leukopenia/neutropenia: ANC2 3.Thrombocytopenia: Platelets139  Bone metastases: On Zometa, will switch her to every 4-monthZometa. PET CT scan 11/10/2019: No evidence of recurrent breast cancer. No evidence of metastatic disease in the chest abdomen pelvis. Evidence of diffuse treated bone metastatic disease without hypermetabolism.  [facility]    No orders of the defined types were placed in this encounter.  The patient has a good understanding of the overall plan. she agrees with it. she will call with any problems that may develop before the next visit here.  Total time spent: 30 mins including face to face time and time spent for planning, charting and coordination of care  GNicholas Lose MD 12/22/2019  I, MCloyde ReamsDorshimer, am acting as scribe for Dr. VNicholas Lose  I have reviewed the above documentation for accuracy and completeness, and I agree with the above.

## 2019-12-22 ENCOUNTER — Other Ambulatory Visit: Payer: Self-pay

## 2019-12-22 ENCOUNTER — Inpatient Hospital Stay: Payer: Medicare PPO | Attending: Hematology and Oncology | Admitting: Hematology and Oncology

## 2019-12-22 ENCOUNTER — Telehealth: Payer: Self-pay | Admitting: Hematology and Oncology

## 2019-12-22 ENCOUNTER — Inpatient Hospital Stay: Payer: Medicare PPO

## 2019-12-22 DIAGNOSIS — C7951 Secondary malignant neoplasm of bone: Secondary | ICD-10-CM | POA: Insufficient documentation

## 2019-12-22 DIAGNOSIS — C50919 Malignant neoplasm of unspecified site of unspecified female breast: Secondary | ICD-10-CM

## 2019-12-22 DIAGNOSIS — Z170 Estrogen receptor positive status [ER+]: Secondary | ICD-10-CM | POA: Insufficient documentation

## 2019-12-22 DIAGNOSIS — R945 Abnormal results of liver function studies: Secondary | ICD-10-CM | POA: Diagnosis not present

## 2019-12-22 DIAGNOSIS — D72819 Decreased white blood cell count, unspecified: Secondary | ICD-10-CM | POA: Diagnosis not present

## 2019-12-22 DIAGNOSIS — C50911 Malignant neoplasm of unspecified site of right female breast: Secondary | ICD-10-CM | POA: Insufficient documentation

## 2019-12-22 DIAGNOSIS — D696 Thrombocytopenia, unspecified: Secondary | ICD-10-CM | POA: Diagnosis not present

## 2019-12-22 LAB — CBC WITH DIFFERENTIAL (CANCER CENTER ONLY)
Abs Immature Granulocytes: 0.01 10*3/uL (ref 0.00–0.07)
Basophils Absolute: 0.1 10*3/uL (ref 0.0–0.1)
Basophils Relative: 1 %
Eosinophils Absolute: 0.2 10*3/uL (ref 0.0–0.5)
Eosinophils Relative: 4 %
HCT: 39.3 % (ref 36.0–46.0)
Hemoglobin: 13.1 g/dL (ref 12.0–15.0)
Immature Granulocytes: 0 %
Lymphocytes Relative: 29 %
Lymphs Abs: 1.1 10*3/uL (ref 0.7–4.0)
MCH: 33.6 pg (ref 26.0–34.0)
MCHC: 33.3 g/dL (ref 30.0–36.0)
MCV: 100.8 fL — ABNORMAL HIGH (ref 80.0–100.0)
Monocytes Absolute: 0.4 10*3/uL (ref 0.1–1.0)
Monocytes Relative: 11 %
Neutro Abs: 2 10*3/uL (ref 1.7–7.7)
Neutrophils Relative %: 55 %
Platelet Count: 139 10*3/uL — ABNORMAL LOW (ref 150–400)
RBC: 3.9 MIL/uL (ref 3.87–5.11)
RDW: 13.2 % (ref 11.5–15.5)
WBC Count: 3.7 10*3/uL — ABNORMAL LOW (ref 4.0–10.5)
nRBC: 0 % (ref 0.0–0.2)

## 2019-12-22 LAB — CMP (CANCER CENTER ONLY)
ALT: 32 U/L (ref 0–44)
AST: 26 U/L (ref 15–41)
Albumin: 3.9 g/dL (ref 3.5–5.0)
Alkaline Phosphatase: 74 U/L (ref 38–126)
Anion gap: 8 (ref 5–15)
BUN: 18 mg/dL (ref 8–23)
CO2: 25 mmol/L (ref 22–32)
Calcium: 9.1 mg/dL (ref 8.9–10.3)
Chloride: 109 mmol/L (ref 98–111)
Creatinine: 0.99 mg/dL (ref 0.44–1.00)
GFR, Estimated: >60 mL/min (ref >60)
Glucose, Bld: 88 mg/dL (ref 70–99)
Potassium: 4.2 mmol/L (ref 3.5–5.1)
Sodium: 142 mmol/L (ref 135–145)
Total Bilirubin: 0.7 mg/dL (ref 0.3–1.2)
Total Protein: 6.6 g/dL (ref 6.5–8.1)

## 2019-12-22 MED ORDER — AMOXICILLIN 500 MG PO CAPS: 500.0000 mg | ORAL_CAPSULE | Freq: Two times a day (BID) | ORAL | 0 refills | Status: DC

## 2019-12-22 NOTE — Assessment & Plan Note (Signed)
Metastatic breast cancer:Referred by Dr. Lebron Quam in [place]. Right breast biopsy on 09/25/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 Patient moved from [place] because she could not tolerate the heat.  PET scan on 05/22/19 showed resolution of the multiple osseous metastatic lesions, right chest wall mass, and bilateral axillary lymphadenopathy.  Current treatment:Abemaciclib, anastrozole and Zometa  Toxicities: 1.Elevated LFTs:We reduce the dosage to 100 mg p.o. twice daily.  2.leukopenia/neutropenia: ANC 3.Thrombocytopenia: Platelets  Bone metastases: On Zometa, will switch her to every 52-monthZometa. PET CT scan 11/10/2019: No evidence of recurrent breast cancer. No evidence of metastatic disease in the chest abdomen pelvis. Evidence of diffuse treated bone metastatic disease without hypermetabolism.  Return to clinic in4 weeks for labs and follow-up.

## 2019-12-22 NOTE — Telephone Encounter (Signed)
Scheduled appts per 11/15 los. Pt declined print out of AVS and stated she would refer to mychart.

## 2020-01-22 ENCOUNTER — Telehealth: Payer: Self-pay | Admitting: Pharmacist

## 2020-01-22 NOTE — Telephone Encounter (Addendum)
Oral Oncology Pharmacist Encounter  Met with patient in clinic for re-enrollment in [facility] Patient Assistance Program in an effort to reduce the patient's out of pocket expense for Verzenio (abemaciclib) to $0.  Updated financial documents faxed to: [place] patient assistance phone number for follow up is: [place], PharmD, BCPS Hematology/Oncology Clinical Pharmacist [facility] [phone] 01/22/2020 9:12 AM

## 2020-02-02 ENCOUNTER — Telehealth: Payer: Self-pay | Admitting: *Deleted

## 2020-02-02 NOTE — Telephone Encounter (Signed)
Received call from pt with symptoms of Covid x4 days.  Pt states she is experiencing fevers because she breaks out in sweats (hasn't taken an oral temperature), nasal congestion, and runny nose.  Pt states her husband tested positive for Covid over the weekend.  Pt advised to get tested for Covid and alert our office when results are back.  Pt verbalized understanding and states she will go to her local [facility] to get tested today.

## 2020-02-04 NOTE — Telephone Encounter (Signed)
Patient has been re-enrolled in [employer] for [employer] at no cost 02/07/20-02/05/21  Norma Murray CPHT Specialty Pharmacy Patient [facility] Phone [phone] Fax [phone] 02/04/2020 12:10 PM

## 2020-02-05 ENCOUNTER — Other Ambulatory Visit: Payer: Medicare PPO

## 2020-02-05 DIAGNOSIS — Z20822 Contact with and (suspected) exposure to covid-19: Secondary | ICD-10-CM

## 2020-02-08 LAB — SARS-COV-2, NAA 2 DAY TAT

## 2020-02-08 LAB — NOVEL CORONAVIRUS, NAA: SARS-CoV-2, NAA: DETECTED — AB

## 2020-02-09 ENCOUNTER — Telehealth (HOSPITAL_COMMUNITY): Payer: Self-pay

## 2020-02-09 ENCOUNTER — Telehealth: Payer: Self-pay

## 2020-02-09 ENCOUNTER — Other Ambulatory Visit: Payer: Self-pay | Admitting: Family

## 2020-02-09 ENCOUNTER — Encounter: Payer: Self-pay | Admitting: Family

## 2020-02-09 MED ORDER — NIRMATRELVIR/RITONAVIR (PAXLOVID)TABLET: 2.0000 | ORAL_TABLET | Freq: Two times a day (BID) | ORAL | 0 refills | Status: AC

## 2020-02-09 MED FILL — PAXLOVID 20 X 150 MG & 10 X: 20 X 150 MG | 5 days supply | Qty: 20 | Fill #0

## 2020-02-09 NOTE — Progress Notes (Signed)
Outpatient Oral COVID Treatment Note  I connected with Norma Murray on 02/09/2020/3:31 PM by telephone and verified that I am speaking with the correct person using two identifiers.  I discussed the limitations, risks, security, and privacy concerns of performing an evaluation and management service by telephone and the availability of in person appointments. I also discussed with the patient that there may be a patient responsible charge related to this service. The patient expressed understanding and agreed to proceed.  Patient location: [place] residence Provider location: home  Diagnosis: COVID-19 infection  Purpose of visit: Discussion of potential use of Molnupiravir or Paxlovid, a new treatment for mild to moderate COVID-19 viral infection in non-hospitalized patients.   Subjective: Patient is a 70 y.o. female who has been diagnosed with COVID 19 viral infection.  Their symptoms began on 02/05/20 with diarrhea, loss of taste, headache.    Past Medical History:  Diagnosis Date  . Family history of bone cancer   . Family history of breast cancer   . Family history of lung cancer     No Known Allergies  (Not in a hospital admission)   Current Outpatient Medications  Medication Sig Dispense Refill Last Dose  . abemaciclib (VERZENIO) 100 MG tablet Take 1 tablet (100 mg total) by mouth 2 (two) times daily. Swallow tablets whole. Do not chew, crush, or split tablets before swallowing. 56 tablet 6   . anastrozole (ARIMIDEX) 1 MG tablet Take 1 tablet (1 mg total) by mouth daily. 90 tablet 3   . levothyroxine (SYNTHROID) 75 MCG tablet Take 1 tablet (75 mcg total) by mouth daily before breakfast.      No current facility-administered medications for this visit.     Objective: Patient appears/sounds well.  They are in no apparent distress.  Breathing is non labored.  Mood and behavior are normal.  Laboratory Data:  Recent Results (from the past 2160 hour(s))  CMP (Cancer Center only)      Status: Abnormal   Collection Time: 11/24/19  8:05 AM  Result Value Ref Range   Sodium 139 135 - 145 mmol/L   Potassium 4.1 3.5 - 5.1 mmol/L   Chloride 110 98 - 111 mmol/L   CO2 23 22 - 32 mmol/L   Glucose, Bld 104 (H) 70 - 99 mg/dL    Comment: Glucose reference range applies only to samples taken after fasting for at least 8 hours.   BUN 15 8 - 23 mg/dL   Creatinine 3.54 6.56 - 1.00 mg/dL   Calcium 9.3 8.9 - 81.2 mg/dL   Total Protein 6.7 6.5 - 8.1 g/dL   Albumin 3.9 3.5 - 5.0 g/dL   AST 55 (H) 15 - 41 U/L   ALT 73 (H) 0 - 44 U/L   Alkaline Phosphatase 108 38 - 126 U/L   Total Bilirubin 0.5 0.3 - 1.2 mg/dL   GFR, Estimated >75 >17 mL/min   Anion gap 6 5 - 15    Comment: Performed at [facility], [address], [place], [place] [address]  CBC with Differential (Cancer Center Only)     Status: Abnormal   Collection Time: 11/24/19  8:05 AM  Result Value Ref Range   WBC Count 3.8 (L) 4.0 - 10.5 K/uL   RBC 3.93 3.87 - 5.11 MIL/uL   Hemoglobin 13.1 12.0 - 15.0 g/dL   HCT 94.4 96.7 - 59.1 %   MCV 101.3 (H) 80.0 - 100.0 fL   MCH 33.3 26.0 - 34.0 pg  MCHC 32.9 30.0 - 36.0 g/dL   RDW 12.4 11.5 - 15.5 %   Platelet Count 148 (L) 150 - 400 K/uL   nRBC 0.0 0.0 - 0.2 %   Neutrophils Relative % 55 %   Neutro Abs 2.1 1.7 - 7.7 K/uL   Lymphocytes Relative 26 %   Lymphs Abs 1.0 0.7 - 4.0 K/uL   Monocytes Relative 14 %   Monocytes Absolute 0.5 0.1 - 1.0 K/uL   Eosinophils Relative 2 %   Eosinophils Absolute 0.1 0.0 - 0.5 K/uL   Basophils Relative 2 %   Basophils Absolute 0.1 0.0 - 0.1 K/uL   Immature Granulocytes 1 %   Abs Immature Granulocytes 0.04 0.00 - 0.07 K/uL    Comment: Performed at [facility], [place] [address] [place], [place] [address]  CBC with Differential ([place] Only)     Status: Abnormal   Collection Time: 12/22/19  7:50 AM  Result Value Ref Range   WBC Count 3.7 (L) 4.0 - 10.5 K/uL   RBC 3.90 3.87 - 5.11  MIL/uL   Hemoglobin 13.1 12.0 - 15.0 g/dL   HCT 39.3 36.0 - 46.0 %   MCV 100.8 (H) 80.0 - 100.0 fL   MCH 33.6 26.0 - 34.0 pg   MCHC 33.3 30.0 - 36.0 g/dL   RDW 13.2 11.5 - 15.5 %   Platelet Count 139 (L) 150 - 400 K/uL   nRBC 0.0 0.0 - 0.2 %   Neutrophils Relative % 55 %   Neutro Abs 2.0 1.7 - 7.7 K/uL   Lymphocytes Relative 29 %   Lymphs Abs 1.1 0.7 - 4.0 K/uL   Monocytes Relative 11 %   Monocytes Absolute 0.4 0.1 - 1.0 K/uL   Eosinophils Relative 4 %   Eosinophils Absolute 0.2 0.0 - 0.5 K/uL   Basophils Relative 1 %   Basophils Absolute 0.1 0.0 - 0.1 K/uL   Immature Granulocytes 0 %   Abs Immature Granulocytes 0.01 0.00 - 0.07 K/uL    Comment: Performed at [facility], [place] [address] [place], [place] [address]  CMP ([place] only)     Status: None   Collection Time: 12/22/19  7:50 AM  Result Value Ref Range   Sodium 142 135 - 145 mmol/L   Potassium 4.2 3.5 - 5.1 mmol/L   Chloride 109 98 - 111 mmol/L   CO2 25 22 - 32 mmol/L   Glucose, Bld 88 70 - 99 mg/dL    Comment: Glucose reference range applies only to samples taken after fasting for at least 8 hours.   BUN 18 8 - 23 mg/dL   Creatinine 0.99 0.44 - 1.00 mg/dL   Calcium 9.1 8.9 - 10.3 mg/dL   Total Protein 6.6 6.5 - 8.1 g/dL   Albumin 3.9 3.5 - 5.0 g/dL   AST 26 15 - 41 U/L   ALT 32 0 - 44 U/L   Alkaline Phosphatase 74 38 - 126 U/L   Total Bilirubin 0.7 0.3 - 1.2 mg/dL   GFR, Estimated >60 >60 mL/min    Comment: (NOTE) Calculated using the CKD-EPI Creatinine Equation (2021)    Anion gap 8 5 - 15    Comment: Performed at [facility], [place] [address], [place], [place] [address]  Novel Coronavirus, NAA ([facility])     Status: Abnormal   Collection Time: 02/05/20  9:04 AM   Specimen: Nasopharyngeal(NP) swabs in vial transport medium   Nasopharynge  Result Value Ref Range   SARS-CoV-2, NAA Detected (A) Not Detected    Comment: Patients who have a positive  COVID-19 test result may now have treatment options. Treatment options are available for patients with mild to moderate symptoms and for hospitalized patients. Visit our website at [email or web address] for resources and information. This nucleic acid amplification test was developed and its performance characteristics determined by [employer] and [employer]. Nucleic acid amplification tests include RT-PCR and TMA. This test has not been FDA cleared or approved. This test has been authorized by FDA under an Emergency Use Authorization (EUA). This test is only authorized for the duration of time the declaration that circumstances exist justifying the authorization of the emergency use of in vitro diagnostic tests for detection of SARS-CoV-2 virus and/or diagnosis of COVID-19 infection under section 564(b)(1) of the Act, 21 U.S.C. PT:2852782) (1), unless the authorization is terminated or revoked sooner. When diagnostic testing is negativ e, the possibility of a false negative result should be considered in the context of a patient's recent exposures and the presence of clinical signs and symptoms consistent with COVID-19. An individual without symptoms of COVID-19 and who is not shedding SARS-CoV-2 virus would expect to have a negative (not detected) result in this assay.   SARS-COV-2, NAA 2 DAY TAT     Status: None   Collection Time: 02/05/20  9:04 AM   Nasopharynge  Result Value Ref Range   SARS-CoV-2, NAA 2 DAY TAT Performed      Assessment: 70 y.o. female with mild/moderate COVID 19 viral infection diagnosed on 02/05/20 at high risk for progression to severe COVID 19.  Plan:  This patient is a 70 y.o. female that meets the following criteria for Emergency Use Authorization of: Paxlovid 1. Age >12 yr AND > 40 kg 2. SARS-COV-2 positive test 3. Symptom onset < 5 days 4. Mild-to-moderate COVID disease with high risk for severe progression to hospitalization or death  I have  spoken and communicated the following to the patient or parent/caregiver regarding: 1. Paxlovid is an unapproved drug that is authorized for use under an Emergency Use Authorization.  2. There are no adequate, approved, available products for the treatment of COVID-19 in adults who have mild-to-moderate COVID-19 and are at high risk for progressing to severe COVID-19, including hospitalization or death. 3. Other therapeutics are currently authorized. For additional information on all products authorized for treatment or prevention of COVID-19, please see [email or web address].  4. There are benefits and risks of taking this treatment as outlined in the "Fact Sheet for Patients and Caregivers."  5. "Fact Sheet for Patients and Caregivers" was reviewed with patient. A hard copy will be provided to patient from pharmacy prior to the patient receiving treatment. 6. Patients should continue to self-isolate and use infection control measures (e.g., wear mask, isolate, social distance, avoid sharing personal items, clean and disinfect "high touch" surfaces, and frequent handwashing) according to CDC guidelines.  7. The patient or parent/caregiver has the option to accept or refuse treatment. 8. Patient medication history was reviewed for potential drug interactions:Interaction with home meds: Per pharmacy review, already on reduced dose of Abemaciclib. Recommend monitoring of Levothyroxine   After reviewing above information with the patient, the patient agrees to receive Paxlovid.  Follow up instructions:    . Take prescription BID x 5 days as directed . Reach out to pharmacist for counseling on medication if desired . For concerns regarding further COVID symptoms please follow up with your PCP or urgent care .  For urgent or life-threatening issues, seek care at your local emergency department  The  patient was provided an opportunity to ask questions, and all were answered. The patient agreed with the plan and demonstrated an understanding of the instructions.   Script sent to [facility] and opted to pick up RX.  The patient was advised to call their PCP or seek an in-person evaluation if the symptoms worsen or if the condition fails to improve as anticipated.   I provided 25 minutes of non face-to-face telephone visit time during this encounter, and > 50% was spent counseling as documented under my assessment & plan.  Loel Dubonnet, NP 02/09/2020 /3:31 PM

## 2020-02-09 NOTE — Telephone Encounter (Addendum)
Called to discuss with patient about COVID-19 symptoms and the use of one of the available treatments for those with mild to moderate Covid symptoms and at a high risk of hospitalization.  Pt appears to qualify for outpatient treatment due to co-morbid conditions and/or a member of an at-risk group in accordance with the FDA Emergency Use Authorization.    Symptom onset: 02/05/20 Vaccinated: x2 (10/18/19 and 12/08/19) sent MyChart message for clarity on which vaccine. Qualifiers: Age >42, Breast CA undergoing tx  Unable to reach pt - Left VM. Sent Mychart message.   Norma Murray

## 2020-02-09 NOTE — Telephone Encounter (Signed)
Called to discuss with patient about COVID-19 symptoms and the use of one of the available treatments for those with mild to moderate Covid symptoms and at a high risk of hospitalization.     Pt appears to qualify for this infusion due to co-morbid conditions and/or a member of an at-risk group in accordance with the FDA Emergency Use Authorization.    Symptom onset: 12/30. Diarrhea, fever, sinus infection, no smell or no taste.   Tested 12/30 at [facility] in [place]  Vaccinated: Yes vaccinated. Dates 9/11. 2nd on 11/01. No booster.    Qualified for Infusion: Stage 4 breast cancer currently undergoing treatment. Age over 14.     Angelia Mould

## 2020-02-09 NOTE — Telephone Encounter (Signed)
Outpatient Oral COVID Treatment.  Patient consented for Paxlovid. Please see 02/09/20 'orders only' encounter for documentation and consent.   Alver Sorrow, NP

## 2020-02-09 NOTE — Telephone Encounter (Signed)
Patient was prescribed oral covid treatment Paxlovid and treatment note was reviewed. Medication has been received by [facility] and reviewed for appropriateness.  Drug Interactions or Dosage Adjustments Noted: Dosage decrease due to Verzenio  Delivery Method: pick-up  Patient contacted for counseling on 02/09/20 and verbalized understanding.   Delivery or Pick-Up Date: 02/09/2020   Kirstie Peri 02/09/2020, 4:03 PM [facility] Outpatient Pharmacist Phone# [phone]

## 2020-02-09 NOTE — Telephone Encounter (Signed)
Pt called to report that she received a positive Covid test result on 12/30.   RN send information to [facility], they will be contacting the patient to set up infusion.  Pt aware, verbalized understanding and agreement.

## 2020-02-09 NOTE — Addendum Note (Signed)
Addended by: Alver Sorrow on: 02/09/2020 06:21 PM   Modules accepted: Kipp Brood

## 2020-02-18 ENCOUNTER — Other Ambulatory Visit: Payer: Self-pay | Admitting: *Deleted

## 2020-02-18 MED ORDER — AMOXICILLIN-POT CLAVULANATE 875-125 MG PO TABS: 1.0000 | ORAL_TABLET | Freq: Two times a day (BID) | ORAL | 0 refills | Status: DC

## 2020-02-18 NOTE — Progress Notes (Signed)
Received call from pt with complaint of headache, sinus pressure, and sinus drainage x1 week.  Pt states she was recently diagnosed with Covid 19 and finished Paxlovid P.O of tx of Covid.  Per MD pt needing prescription for Augmentin 875-125 p.o. BID x7 days.  Prescription sent to pharmacy on file and pt verbalized understanding.

## 2020-02-19 ENCOUNTER — Other Ambulatory Visit: Payer: Self-pay | Admitting: Hematology and Oncology

## 2020-02-23 ENCOUNTER — Other Ambulatory Visit: Payer: Self-pay | Admitting: Hematology and Oncology

## 2020-02-23 DIAGNOSIS — C50919 Malignant neoplasm of unspecified site of unspecified female breast: Secondary | ICD-10-CM

## 2020-02-25 NOTE — Progress Notes (Signed)
Patient Care Team: Patient, [name] as PCP - General (General Practice)  DIAGNOSIS:    ICD-10-CM   1. Metastatic breast cancer ([place])  C50.919     SUMMARY OF ONCOLOGIC HISTORY: Oncology History  Metastatic breast cancer ([place])  09/25/2018 Initial Diagnosis   Right breast biopsy on 09/25/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    05/22/2019 PET scan   PET scan on 05/22/19 showed resolution of the multiple osseous metastatic lesions, right chest wall mass, and bilateral axillary lymphadenopathy.     Anti-estrogen oral therapy   Abemaciclib, Anastrozole and Zometa.  (Moved from [place])   09/10/2019 Genetic Testing   Negative genetic testing:  No pathogenic variants detected on the [facility] Common Hereditary Cancers panel. The report date is 09/10/2019.   The Common Hereditary Cancers Panel offered by [facility] includes sequencing and/or deletion duplication testing of the following 48 genes: APC, ATM, AXIN2, BARD1, BMPR1A, BRCA1, BRCA2, BRIP1, CDH1, CDK4, CDKN2A (p14ARF), CDKN2A (p16INK4a), CHEK2, CTNNA1, DICER1, EPCAM (Deletion/duplication testing only), GREM1 (promoter region deletion/duplication testing only), KIT, MEN1, MLH1, MSH2, MSH3, MSH6, MUTYH, NBN, NF1, NTHL1, PALB2, PDGFRA, PMS2, POLD1, POLE, PTEN, RAD50, RAD51C, RAD51D, RNF43, SDHB, SDHC, SDHD, SMAD4, SMARCA4. STK11, TP53, TSC1, TSC2, and VHL.  The following genes were evaluated for sequence changes only: SDHA and HOXB13 c.251G>A variant only.      CHIEF COMPLIANT: Follow-up of metastatic breast cancer  INTERVAL HISTORY: Norma Murray is a 70 y.o. with above-mentioned history of metastatic breast cancer currently on treatment withAbemaciclib, anastrozole, and Zometa.She presents to the clinic todayfor follow-upand treatment.   She developed COVID-19 infection at the end of December and received antibiotic treatments.  She tells me that she had profound adverse effects of antibiotic  treatment with mouth sores.  She continues to suffer from sinus inflammation and postnasal drip.  She thinks that she is slowly getting better.  She was treated with Augmentin for this.    ALLERGIES:  has No Known Allergies.  MEDICATIONS:  Current Outpatient Medications  Medication Sig Dispense Refill  . abemaciclib (VERZENIO) 100 MG tablet Take 1 tablet (100 mg total) by mouth 2 (two) times daily. Swallow tablets whole. Do not chew, crush, or split tablets before swallowing. 56 tablet 6  . amoxicillin-clavulanate (AUGMENTIN) 875-125 MG tablet Take 1 tablet by mouth 2 (two) times daily. 14 tablet 0  . anastrozole (ARIMIDEX) 1 MG tablet Take 1 tablet (1 mg total) by mouth daily. 90 tablet 3  . levothyroxine (SYNTHROID) 75 MCG tablet Take 1 tablet (75 mcg total) by mouth daily before breakfast.     No current facility-administered medications for this visit.    PHYSICAL EXAMINATION: ECOG PERFORMANCE STATUS: 1 - Symptomatic but completely ambulatory  Vitals:   02/26/20 0908  BP: 132/83  Pulse: 75  Resp: 18  Temp: 97.7 F (36.5 C)  SpO2: 100%   Filed Weights   02/26/20 0908  Weight: 142 lb (64.4 kg)    LABORATORY DATA:  I have reviewed the data as listed CMP Latest Ref Rng & Units 02/26/2020 12/22/2019 11/24/2019  Glucose 70 - 99 mg/dL 84 88 104(H)  BUN 8 - 23 mg/dL 17 18 15   Creatinine 0.44 - 1.00 mg/dL 1.20(H) 0.99 0.81  Sodium 135 - 145 mmol/L 140 142 139  Potassium 3.5 - 5.1 mmol/L 3.8 4.2 4.1  Chloride 98 - 111 mmol/L 108 109 110  CO2 22 - 32 mmol/L 25 25 23   Calcium 8.9 - 10.3 mg/dL 9.2 9.1  9.3  Total Protein 6.5 - 8.1 g/dL 6.8 6.6 6.7  Total Bilirubin 0.3 - 1.2 mg/dL 0.5 0.7 0.5  Alkaline Phos 38 - 126 U/L 62 74 108  AST 15 - 41 U/L 22 26 55(H)  ALT 0 - 44 U/L 25 32 73(H)    Lab Results  Component Value Date   WBC 3.5 (L) 02/26/2020   HGB 13.0 02/26/2020   HCT 38.8 02/26/2020   MCV 99.2 02/26/2020   PLT 88 (L) 02/26/2020   NEUTROABS 1.8 02/26/2020     ASSESSMENT & PLAN:  Metastatic breast cancer ([place]) Metastatic breast cancer:Referred by Dr. Lebron Quam in [place]. Right breast biopsy on 09/25/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 Patient moved from [place] because she could not tolerate the heat.  PET scan on 05/22/19 showed resolution of the multiple osseous metastatic lesions, right chest wall mass, and bilateral axillary lymphadenopathy.  Current treatment:Abemaciclib, anastrozole and Zometa  Toxicities: 1.Elevated LFTs:We reduced the dosage to 100 mg p.o. twice daily.  She is tolerating it extremely well with normal LFTs. 2.leukopenia/neutropenia: ANC2 3.Thrombocytopenia: Platelets88: I suspect this is related to COVID-19 and its treatment.  We will continue with the same dosing. COVID-19 infection January 2022: Treated with antibiotic treatments.  Bone metastases: On Zometa every 3 months PET CT scan 11/10/2019: No evidence of recurrent breast cancer. No evidence of metastatic disease in the chest abdomen pelvis. Evidence of diffuse treated bone metastatic disease without hypermetabolism.  Return to clinic inJanuary for zometa labs and follow-up after PET scan.    No orders of the defined types were placed in this encounter.  The patient has a good understanding of the overall plan. she agrees with it. she will call with any problems that may develop before the next visit here.  Total time spent: 30 mins including face to face time and time spent for planning, charting and coordination of care  Nicholas Lose, MD 02/26/2020  I, Cloyde Reams Dorshimer, am acting as scribe for Dr. Nicholas Lose.  I have reviewed the above documentation for accuracy and completeness, and I agree with the above.

## 2020-02-26 ENCOUNTER — Inpatient Hospital Stay: Payer: Medicare PPO | Admitting: Hematology and Oncology

## 2020-02-26 ENCOUNTER — Inpatient Hospital Stay: Payer: Medicare PPO

## 2020-02-26 ENCOUNTER — Other Ambulatory Visit: Payer: Self-pay

## 2020-02-26 ENCOUNTER — Inpatient Hospital Stay: Payer: Medicare PPO | Attending: Hematology and Oncology

## 2020-02-26 ENCOUNTER — Ambulatory Visit: Payer: Medicare PPO

## 2020-02-26 ENCOUNTER — Telehealth: Payer: Self-pay | Admitting: Hematology and Oncology

## 2020-02-26 VITALS — BP 159/90 | HR 77 | Resp 17

## 2020-02-26 DIAGNOSIS — C50919 Malignant neoplasm of unspecified site of unspecified female breast: Secondary | ICD-10-CM

## 2020-02-26 DIAGNOSIS — Z170 Estrogen receptor positive status [ER+]: Secondary | ICD-10-CM | POA: Diagnosis not present

## 2020-02-26 DIAGNOSIS — C7951 Secondary malignant neoplasm of bone: Secondary | ICD-10-CM | POA: Insufficient documentation

## 2020-02-26 DIAGNOSIS — C50911 Malignant neoplasm of unspecified site of right female breast: Secondary | ICD-10-CM | POA: Insufficient documentation

## 2020-02-26 LAB — CMP (CANCER CENTER ONLY)
ALT: 25 U/L (ref 0–44)
AST: 22 U/L (ref 15–41)
Albumin: 3.8 g/dL (ref 3.5–5.0)
Alkaline Phosphatase: 62 U/L (ref 38–126)
Anion gap: 7 (ref 5–15)
BUN: 17 mg/dL (ref 8–23)
CO2: 25 mmol/L (ref 22–32)
Calcium: 9.2 mg/dL (ref 8.9–10.3)
Chloride: 108 mmol/L (ref 98–111)
Creatinine: 1.2 mg/dL — ABNORMAL HIGH (ref 0.44–1.00)
GFR, Estimated: 49 mL/min — ABNORMAL LOW (ref >60)
Glucose, Bld: 84 mg/dL (ref 70–99)
Potassium: 3.8 mmol/L (ref 3.5–5.1)
Sodium: 140 mmol/L (ref 135–145)
Total Bilirubin: 0.5 mg/dL (ref 0.3–1.2)
Total Protein: 6.8 g/dL (ref 6.5–8.1)

## 2020-02-26 LAB — CBC WITH DIFFERENTIAL (CANCER CENTER ONLY)
Abs Immature Granulocytes: 0.01 10*3/uL (ref 0.00–0.07)
Basophils Absolute: 0 10*3/uL (ref 0.0–0.1)
Basophils Relative: 1 %
Eosinophils Absolute: 0.1 10*3/uL (ref 0.0–0.5)
Eosinophils Relative: 1 %
HCT: 38.8 % (ref 36.0–46.0)
Hemoglobin: 13 g/dL (ref 12.0–15.0)
Immature Granulocytes: 0 %
Lymphocytes Relative: 34 %
Lymphs Abs: 1.2 10*3/uL (ref 0.7–4.0)
MCH: 33.2 pg (ref 26.0–34.0)
MCHC: 33.5 g/dL (ref 30.0–36.0)
MCV: 99.2 fL (ref 80.0–100.0)
Monocytes Absolute: 0.4 10*3/uL (ref 0.1–1.0)
Monocytes Relative: 13 %
Neutro Abs: 1.8 10*3/uL (ref 1.7–7.7)
Neutrophils Relative %: 51 %
Platelet Count: 88 10*3/uL — ABNORMAL LOW (ref 150–400)
RBC: 3.91 MIL/uL (ref 3.87–5.11)
RDW: 13.2 % (ref 11.5–15.5)
WBC Count: 3.5 10*3/uL — ABNORMAL LOW (ref 4.0–10.5)
nRBC: 0 % (ref 0.0–0.2)

## 2020-02-26 MED ORDER — ZOLEDRONIC ACID 4 MG/100ML IV SOLN
INTRAVENOUS | Status: AC
  Filled 2020-02-26: qty 100

## 2020-02-26 MED ORDER — SODIUM CHLORIDE 0.9 % IV SOLN
Freq: Once | INTRAVENOUS | Status: AC
  Filled 2020-02-26: qty 250

## 2020-02-26 MED ORDER — ZOLEDRONIC ACID 4 MG/100ML IV SOLN
4.0000 mg | Freq: Once | INTRAVENOUS | Status: AC
  Administered 2020-02-26: 4 mg via INTRAVENOUS

## 2020-02-26 NOTE — Assessment & Plan Note (Signed)
Metastatic breast cancer:Referred by Dr. Lebron Quam in [place]. Right breast biopsy on 09/25/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 Patient moved from [place] because she could not tolerate the heat.  PET scan on 05/22/19 showed resolution of the multiple osseous metastatic lesions, right chest wall mass, and bilateral axillary lymphadenopathy.  Current treatment:Abemaciclib, anastrozole and Zometa  Toxicities: 1.Elevated LFTs:We reduced the dosage to 100 mg p.o. twice daily.  She is tolerating it extremely well with normal LFTs. 2.leukopenia/neutropenia: ANC2 3.Thrombocytopenia: Platelets139  Bone metastases: On Zometa every 3 months PET CT scan 11/10/2019: No evidence of recurrent breast cancer. No evidence of metastatic disease in the chest abdomen pelvis. Evidence of diffuse treated bone metastatic disease without hypermetabolism.  Return to clinic inJanuary for [facility] labs and follow-up.

## 2020-02-26 NOTE — Telephone Encounter (Signed)
Scheduled follow-up appointment per 1/20 los. Patient is aware. °

## 2020-03-02 ENCOUNTER — Telehealth: Payer: Self-pay

## 2020-03-02 MED ORDER — CYCLOBENZAPRINE HCL 10 MG PO TABS: 10.0000 mg | ORAL_TABLET | Freq: Three times a day (TID) | ORAL | 0 refills | Status: DC | PRN

## 2020-03-02 NOTE — Telephone Encounter (Signed)
Pt with back spasms to cervical area X 2 days.    Pt with metastatic breast cancer.  Pt reports having back spasms in the past with relief my muscle relaxer's.  Pt tried Tylenol, and heat to area with no relief.   Pt reports spasms are the upper back area, near her shoulders.  Decease ROM due to discomfort.  Denies any injuries to area.    RN will review with MD for recommendations.

## 2020-03-02 NOTE — Telephone Encounter (Signed)
Per MD recommendations Flexeril 10mg  TID, PRN, #20 0 refills.    Pt aware, Rx called into pharmacy.

## 2020-04-06 ENCOUNTER — Telehealth: Payer: Self-pay | Admitting: *Deleted

## 2020-04-06 NOTE — Telephone Encounter (Signed)
Received call from pt requesting lab work be drawn tomorrow. Pt states last office visit her plts were low and crt was high.  MD notified and verbal orders received for pt to receive CBC and CMP tomorrow.  Apt scheduled and pt verbalized understanding of apt date and time.

## 2020-04-07 ENCOUNTER — Other Ambulatory Visit: Payer: Self-pay

## 2020-04-07 ENCOUNTER — Inpatient Hospital Stay: Payer: Medicare PPO | Attending: Hematology and Oncology

## 2020-04-07 DIAGNOSIS — C7951 Secondary malignant neoplasm of bone: Secondary | ICD-10-CM | POA: Diagnosis present

## 2020-04-07 DIAGNOSIS — C50911 Malignant neoplasm of unspecified site of right female breast: Secondary | ICD-10-CM | POA: Diagnosis present

## 2020-04-07 DIAGNOSIS — C50919 Malignant neoplasm of unspecified site of unspecified female breast: Secondary | ICD-10-CM

## 2020-04-07 LAB — CMP (CANCER CENTER ONLY)
ALT: 23 U/L (ref 0–44)
AST: 19 U/L (ref 15–41)
Albumin: 4.1 g/dL (ref 3.5–5.0)
Alkaline Phosphatase: 58 U/L (ref 38–126)
Anion gap: 7 (ref 5–15)
BUN: 17 mg/dL (ref 8–23)
CO2: 25 mmol/L (ref 22–32)
Calcium: 9.1 mg/dL (ref 8.9–10.3)
Chloride: 109 mmol/L (ref 98–111)
Creatinine: 1.17 mg/dL — ABNORMAL HIGH (ref 0.44–1.00)
GFR, Estimated: 51 mL/min — ABNORMAL LOW (ref >60)
Glucose, Bld: 105 mg/dL — ABNORMAL HIGH (ref 70–99)
Potassium: 4.2 mmol/L (ref 3.5–5.1)
Sodium: 141 mmol/L (ref 135–145)
Total Bilirubin: 0.5 mg/dL (ref 0.3–1.2)
Total Protein: 6.9 g/dL (ref 6.5–8.1)

## 2020-04-07 LAB — CBC WITH DIFFERENTIAL (CANCER CENTER ONLY)
Abs Immature Granulocytes: 0.02 10*3/uL (ref 0.00–0.07)
Basophils Absolute: 0.1 10*3/uL (ref 0.0–0.1)
Basophils Relative: 1 %
Eosinophils Absolute: 0.2 10*3/uL (ref 0.0–0.5)
Eosinophils Relative: 4 %
HCT: 39.8 % (ref 36.0–46.0)
Hemoglobin: 13.1 g/dL (ref 12.0–15.0)
Immature Granulocytes: 0 %
Lymphocytes Relative: 19 %
Lymphs Abs: 0.9 10*3/uL (ref 0.7–4.0)
MCH: 33.5 pg (ref 26.0–34.0)
MCHC: 32.9 g/dL (ref 30.0–36.0)
MCV: 101.8 fL — ABNORMAL HIGH (ref 80.0–100.0)
Monocytes Absolute: 0.5 10*3/uL (ref 0.1–1.0)
Monocytes Relative: 10 %
Neutro Abs: 3.2 10*3/uL (ref 1.7–7.7)
Neutrophils Relative %: 66 %
Platelet Count: 147 10*3/uL — ABNORMAL LOW (ref 150–400)
RBC: 3.91 MIL/uL (ref 3.87–5.11)
RDW: 13.8 % (ref 11.5–15.5)
WBC Count: 4.9 10*3/uL (ref 4.0–10.5)
nRBC: 0 % (ref 0.0–0.2)

## 2020-04-26 ENCOUNTER — Other Ambulatory Visit (HOSPITAL_COMMUNITY): Payer: Medicare PPO

## 2020-05-19 ENCOUNTER — Other Ambulatory Visit: Payer: Self-pay

## 2020-05-19 ENCOUNTER — Ambulatory Visit (HOSPITAL_COMMUNITY)
Admission: RE | Admit: 2020-05-19 | Discharge: 2020-05-19 | Disposition: A | Discharge status: Home / Self Care | Payer: Medicare PPO | Source: Ambulatory Visit | Attending: Hematology and Oncology | Admitting: Hematology and Oncology

## 2020-05-19 DIAGNOSIS — C7951 Secondary malignant neoplasm of bone: Secondary | ICD-10-CM | POA: Insufficient documentation

## 2020-05-19 DIAGNOSIS — C50919 Malignant neoplasm of unspecified site of unspecified female breast: Secondary | ICD-10-CM | POA: Diagnosis present

## 2020-05-19 LAB — GLUCOSE, CAPILLARY: Glucose-Capillary: 89 mg/dL (ref 70–99)

## 2020-05-19 MED ORDER — FLUDEOXYGLUCOSE F - 18 (FDG) INJECTION
7.1900 | Freq: Once | INTRAVENOUS | Status: AC
  Administered 2020-05-19: 7.19 via INTRAVENOUS

## 2020-05-30 NOTE — Assessment & Plan Note (Signed)
Right breast biopsy on 09/25/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 Patient moved from [place] because she could not tolerate the heat.  PET scan on 05/22/19 showed resolution of the multiple osseous metastatic lesions, right chest wall mass, and bilateral axillary lymphadenopathy.  Current treatment:Abemaciclib, anastrozole and Zometa  Toxicities: 1.Elevated LFTs:We reducedthe dosage to100 mg p.o. twice daily. She is tolerating it extremely well with normal LFTs. 2.leukopenia/neutropenia: ANC2 3.Thrombocytopenia: Platelets88: I suspect this is related to COVID-19 and its treatment.  We will continue with the same dosing. COVID-19 infection January 2022: Treated with antibiotic treatments.  Bone metastases: On Zometa every 3 months PET CT scan 05/20/20: No evidence of recurrent breast cancer. No evidence of metastatic disease in the chest abdomen pelvis. Evidence of diffuse treated bone metastatic disease without hypermetabolism.   Continue current treatment RTC in 3 months with labs

## 2020-05-30 NOTE — Progress Notes (Signed)
Patient Care Team: Patient, [name] (Inactive) as PCP - General (General Practice)  DIAGNOSIS:    ICD-10-CM   1. Metastatic breast cancer ([place])  C50.919     SUMMARY OF ONCOLOGIC HISTORY: Oncology History  Metastatic breast cancer ([place])  09/25/2018 Initial Diagnosis   Right breast biopsy on 09/25/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    05/22/2019 PET scan   PET scan on 05/22/19 showed resolution of the multiple osseous metastatic lesions, right chest wall mass, and bilateral axillary lymphadenopathy.     Anti-estrogen oral therapy   Abemaciclib, Anastrozole and Zometa.  (Moved from [place])   09/10/2019 Genetic Testing   Negative genetic testing:  No pathogenic variants detected on the [facility] Common Hereditary Cancers panel. The report date is 09/10/2019.   The Common Hereditary Cancers Panel offered by [facility] includes sequencing and/or deletion duplication testing of the following 48 genes: APC, ATM, AXIN2, BARD1, BMPR1A, BRCA1, BRCA2, BRIP1, CDH1, CDK4, CDKN2A (p14ARF), CDKN2A (p16INK4a), CHEK2, CTNNA1, DICER1, EPCAM (Deletion/duplication testing only), GREM1 (promoter region deletion/duplication testing only), KIT, MEN1, MLH1, MSH2, MSH3, MSH6, MUTYH, NBN, NF1, NTHL1, PALB2, PDGFRA, PMS2, POLD1, POLE, PTEN, RAD50, RAD51C, RAD51D, RNF43, SDHB, SDHC, SDHD, SMAD4, SMARCA4. STK11, TP53, TSC1, TSC2, and VHL.  The following genes were evaluated for sequence changes only: SDHA and HOXB13 c.251G>A variant only.      CHIEF COMPLIANT: Follow-up of metastatic breast cancer  INTERVAL HISTORY: Tasmia Blumer is a 70 y.o. with above-mentioned history of metastatic breast cancer currently on treatment withAbemaciclib, anastrozole, and Zometa. PET scan on 05/19/20 showed no evidence of recurrence or progression, with stable sclerotic metastases.She presents to the clinic todayfor treatment and to review her scan.   ALLERGIES:  has No Known  Allergies.  MEDICATIONS:  Current Outpatient Medications  Medication Sig Dispense Refill  . abemaciclib (VERZENIO) 100 MG tablet Take 1 tablet (100 mg total) by mouth 2 (two) times daily. Swallow tablets whole. Do not chew, crush, or split tablets before swallowing. 56 tablet 6  . anastrozole (ARIMIDEX) 1 MG tablet Take 1 tablet (1 mg total) by mouth daily. 90 tablet 3  . cyclobenzaprine (FLEXERIL) 10 MG tablet Take 1 tablet (10 mg total) by mouth 3 (three) times daily as needed for muscle spasms. 20 tablet 0  . levothyroxine (SYNTHROID) 75 MCG tablet Take 1 tablet (75 mcg total) by mouth daily before breakfast.    . Nirmatrelvir & Ritonavir 20 x 150 MG & 10 x 100MG TBPK TAKE NIRMATRELVIR (150 MG) ONE TABLET TWICE DAILY FOR 5 DAYS AND RITONAVIR (100 MG) ONE TAB TWICE DAILY FOR 5 DAYS. 20 each 0   No current facility-administered medications for this visit.    PHYSICAL EXAMINATION: ECOG PERFORMANCE STATUS: 1 - Symptomatic but completely ambulatory  There were no vitals filed for this visit. There were no vitals filed for this visit.   LABORATORY DATA:  I have reviewed the data as listed CMP Latest Ref Rng & Units 04/07/2020 02/26/2020 12/22/2019  Glucose 70 - 99 mg/dL 105(H) 84 88  BUN 8 - 23 mg/dL 17 17 18   Creatinine 0.44 - 1.00 mg/dL 1.17(H) 1.20(H) 0.99  Sodium 135 - 145 mmol/L 141 140 142  Potassium 3.5 - 5.1 mmol/L 4.2 3.8 4.2  Chloride 98 - 111 mmol/L 109 108 109  CO2 22 - 32 mmol/L 25 25 25   Calcium 8.9 - 10.3 mg/dL 9.1 9.2 9.1  Total Protein 6.5 - 8.1 g/dL 6.9 6.8 6.6  Total Bilirubin 0.3 -  1.2 mg/dL 0.5 0.5 0.7  Alkaline Phos 38 - 126 U/L 58 62 74  AST 15 - 41 U/L 19 22 26   ALT 0 - 44 U/L 23 25 32    Lab Results  Component Value Date   WBC 3.4 (L) 05/31/2020   HGB 13.2 05/31/2020   HCT 40.1 05/31/2020   MCV 103.1 (H) 05/31/2020   PLT 140 (L) 05/31/2020   NEUTROABS 1.8 05/31/2020    ASSESSMENT & PLAN:  Metastatic breast cancer ([place]) Right breast biopsy on  09/25/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 Patient moved from [place] because she could not tolerate the heat.  PET scan on 05/22/19 showed resolution of the multiple osseous metastatic lesions, right chest wall mass, and bilateral axillary lymphadenopathy.  Current treatment:Abemaciclib, anastrozole and Zometa  Toxicities: 1.Elevated LFTs:We reducedthe dosage to100 mg p.o. twice daily. She is tolerating it extremely well with normal LFTs. 2.leukopenia/neutropenia: ANC2 3.Thrombocytopenia: Platelets88: I suspect this is related to COVID-19 and its treatment.  We will continue with the same dosing. COVID-19 infection January 2022: Treated with antibiotic treatments.  Bone metastases: On Zometa every 3 months PET CT scan 05/20/20: No evidence of recurrent breast cancer. No evidence of metastatic disease in the chest abdomen pelvis. Evidence of diffuse treated bone metastatic disease without hypermetabolism.  Hypothyroidism: I am feeling her thyroid medication.  We will try to add TSH to today's labs.  Continue current treatment RTC in 3 months with labs and Zometa       No orders of the defined types were placed in this encounter.  The patient has a good understanding of the overall plan. she agrees with it. she will call with any problems that may develop before the next visit here.  Total time spent: 30 mins including face to face time and time spent for planning, charting and coordination of care  Rulon Eisenmenger, MD, MPH 05/31/2020  I, Molly Dorshimer, am acting as scribe for Dr. Nicholas Lose.  I have reviewed the above documentation for accuracy and completeness, and I agree with the above.

## 2020-05-31 ENCOUNTER — Other Ambulatory Visit: Payer: Self-pay | Admitting: *Deleted

## 2020-05-31 ENCOUNTER — Inpatient Hospital Stay: Payer: Medicare PPO | Attending: Hematology and Oncology

## 2020-05-31 ENCOUNTER — Inpatient Hospital Stay: Payer: Medicare PPO

## 2020-05-31 ENCOUNTER — Other Ambulatory Visit: Payer: Self-pay

## 2020-05-31 ENCOUNTER — Inpatient Hospital Stay: Payer: Medicare PPO | Admitting: Hematology and Oncology

## 2020-05-31 VITALS — BP 127/70 | HR 71 | Temp 97.7°F | Resp 18 | Ht 65.5 in | Wt 140.8 lb

## 2020-05-31 DIAGNOSIS — Z79811 Long term (current) use of aromatase inhibitors: Secondary | ICD-10-CM | POA: Insufficient documentation

## 2020-05-31 DIAGNOSIS — E039 Hypothyroidism, unspecified: Secondary | ICD-10-CM

## 2020-05-31 DIAGNOSIS — C50919 Malignant neoplasm of unspecified site of unspecified female breast: Secondary | ICD-10-CM | POA: Diagnosis not present

## 2020-05-31 DIAGNOSIS — D696 Thrombocytopenia, unspecified: Secondary | ICD-10-CM | POA: Insufficient documentation

## 2020-05-31 DIAGNOSIS — C50911 Malignant neoplasm of unspecified site of right female breast: Secondary | ICD-10-CM | POA: Diagnosis not present

## 2020-05-31 DIAGNOSIS — D72819 Decreased white blood cell count, unspecified: Secondary | ICD-10-CM | POA: Diagnosis not present

## 2020-05-31 DIAGNOSIS — Z780 Asymptomatic menopausal state: Secondary | ICD-10-CM

## 2020-05-31 DIAGNOSIS — C7951 Secondary malignant neoplasm of bone: Secondary | ICD-10-CM | POA: Diagnosis present

## 2020-05-31 DIAGNOSIS — Z170 Estrogen receptor positive status [ER+]: Secondary | ICD-10-CM | POA: Diagnosis not present

## 2020-05-31 LAB — CMP (CANCER CENTER ONLY)
ALT: 26 U/L (ref 0–44)
AST: 28 U/L (ref 15–41)
Albumin: 4 g/dL (ref 3.5–5.0)
Alkaline Phosphatase: 47 U/L (ref 38–126)
Anion gap: 8 (ref 5–15)
BUN: 16 mg/dL (ref 8–23)
CO2: 24 mmol/L (ref 22–32)
Calcium: 9 mg/dL (ref 8.9–10.3)
Chloride: 108 mmol/L (ref 98–111)
Creatinine: 1.11 mg/dL — ABNORMAL HIGH (ref 0.44–1.00)
GFR, Estimated: 54 mL/min — ABNORMAL LOW (ref >60)
Glucose, Bld: 94 mg/dL (ref 70–99)
Potassium: 4 mmol/L (ref 3.5–5.1)
Sodium: 140 mmol/L (ref 135–145)
Total Bilirubin: 0.7 mg/dL (ref 0.3–1.2)
Total Protein: 6.4 g/dL — ABNORMAL LOW (ref 6.5–8.1)

## 2020-05-31 LAB — CBC WITH DIFFERENTIAL (CANCER CENTER ONLY)
Abs Immature Granulocytes: 0.01 10*3/uL (ref 0.00–0.07)
Basophils Absolute: 0.1 10*3/uL (ref 0.0–0.1)
Basophils Relative: 2 %
Eosinophils Absolute: 0.1 10*3/uL (ref 0.0–0.5)
Eosinophils Relative: 4 %
HCT: 40.1 % (ref 36.0–46.0)
Hemoglobin: 13.2 g/dL (ref 12.0–15.0)
Immature Granulocytes: 0 %
Lymphocytes Relative: 31 %
Lymphs Abs: 1.1 10*3/uL (ref 0.7–4.0)
MCH: 33.9 pg (ref 26.0–34.0)
MCHC: 32.9 g/dL (ref 30.0–36.0)
MCV: 103.1 fL — ABNORMAL HIGH (ref 80.0–100.0)
Monocytes Absolute: 0.4 10*3/uL (ref 0.1–1.0)
Monocytes Relative: 11 %
Neutro Abs: 1.8 10*3/uL (ref 1.7–7.7)
Neutrophils Relative %: 52 %
Platelet Count: 140 10*3/uL — ABNORMAL LOW (ref 150–400)
RBC: 3.89 MIL/uL (ref 3.87–5.11)
RDW: 13 % (ref 11.5–15.5)
WBC Count: 3.4 10*3/uL — ABNORMAL LOW (ref 4.0–10.5)
nRBC: 0 % (ref 0.0–0.2)

## 2020-05-31 MED ORDER — ZOLEDRONIC ACID 4 MG/100ML IV SOLN
INTRAVENOUS | Status: AC
  Filled 2020-05-31: qty 100

## 2020-05-31 MED ORDER — SODIUM CHLORIDE 0.9 % IV SOLN
Freq: Once | INTRAVENOUS | Status: AC
  Filled 2020-05-31: qty 250

## 2020-05-31 MED ORDER — ZOLEDRONIC ACID 4 MG/100ML IV SOLN
4.0000 mg | Freq: Once | INTRAVENOUS | Status: AC
  Administered 2020-05-31: 4 mg via INTRAVENOUS

## 2020-05-31 MED ORDER — LEVOTHYROXINE SODIUM 75 MCG PO TABS: 75.0000 ug | ORAL_TABLET | Freq: Every day | ORAL | 3 refills | Status: DC

## 2020-05-31 NOTE — Patient Instructions (Signed)
Zoledronic Acid Injection (Hypercalcemia, Oncology) What is this medicine? ZOLEDRONIC ACID (ZOE le dron ik AS id) slows calcium loss from bones. It high calcium levels in the blood from some kinds of cancer. It may be used in other people at risk for bone loss. This medicine may be used for other purposes; ask your health care provider or pharmacist if you have questions. COMMON BRAND NAME(S): Zometa What should I tell my health care provider before I take this medicine? They need to know if you have any of these conditions:  cancer  dehydration  dental disease  kidney disease  liver disease  low levels of calcium in the blood  lung or breathing disease (asthma)  receiving steroids like dexamethasone or prednisone  an unusual or allergic reaction to zoledronic acid, other medicines, foods, dyes, or preservatives  pregnant or trying to get pregnant  breast-feeding How should I use this medicine? This drug is injected into a vein. It is given by a health care provider in a hospital or clinic setting. Talk to your health care provider about the use of this drug in children. Special care may be needed. Overdosage: If you think you have taken too much of this medicine contact a poison control center or emergency room at once. NOTE: This medicine is only for you. Do not share this medicine with others. What if I miss a dose? Keep appointments for follow-up doses. It is important not to miss your dose. Call your health care provider if you are unable to keep an appointment. What may interact with this medicine?  certain antibiotics given by injection  NSAIDs, medicines for pain and inflammation, like ibuprofen or naproxen  some diuretics like bumetanide, furosemide  teriparatide  thalidomide This list may not describe all possible interactions. Give your health care provider a list of all the medicines, herbs, non-prescription drugs, or dietary supplements you use. Also tell  them if you smoke, drink alcohol, or use illegal drugs. Some items may interact with your medicine. What should I watch for while using this medicine? Visit your health care provider for regular checks on your progress. It may be some time before you see the benefit from this drug. Some people who take this drug have severe bone, joint, or muscle pain. This drug may also increase your risk for jaw problems or a broken thigh bone. Tell your health care provider right away if you have severe pain in your jaw, bones, joints, or muscles. Tell you health care provider if you have any pain that does not go away or that gets worse. Tell your dentist and dental surgeon that you are taking this drug. You should not have major dental surgery while on this drug. See your dentist to have a dental exam and fix any dental problems before starting this drug. Take good care of your teeth while on this drug. Make sure you see your dentist for regular follow-up appointments. You should make sure you get enough calcium and vitamin D while you are taking this drug. Discuss the foods you eat and the vitamins you take with your health care provider. Check with your health care provider if you have severe diarrhea, nausea, and vomiting, or if you sweat a lot. The loss of too much body fluid may make it dangerous for you to take this drug. You may need blood work done while you are taking this drug. Do not become pregnant while taking this drug. Women should inform their health care provider   if they wish to become pregnant or think they might be pregnant. There is potential for serious harm to an unborn child. Talk to your health care provider for more information. What side effects may I notice from receiving this medicine? Side effects that you should report to your doctor or health care provider as soon as possible:  allergic reactions (skin rash, itching or hives; swelling of the face, lips, or tongue)  bone  pain  infection (fever, chills, cough, sore throat, pain or trouble passing urine)  jaw pain, especially after dental work  joint pain  kidney injury (trouble passing urine or change in the amount of urine)  low blood pressure (dizziness; feeling faint or lightheaded, falls; unusually weak or tired)  low calcium levels (fast heartbeat; muscle cramps or pain; pain, tingling, or numbness in the hands or feet; seizures)  low magnesium levels (fast, irregular heartbeat; muscle cramp or pain; muscle weakness; tremors; seizures)  low red blood cell counts (trouble breathing; feeling faint; lightheaded, falls; unusually weak or tired)  muscle pain  redness, blistering, peeling, or loosening of the skin, including inside the mouth  severe diarrhea  swelling of the ankles, feet, hands  trouble breathing Side effects that usually do not require medical attention (report to your doctor or health care provider if they continue or are bothersome):  anxious  constipation  coughing  depressed mood  eye irritation, itching, or pain  fever  general ill feeling or flu-like symptoms  nausea  pain, redness, or irritation at site where injected  trouble sleeping This list may not describe all possible side effects. Call your doctor for medical advice about side effects. You may report side effects to FDA at 1-800-FDA-1088. Where should I keep my medicine? This drug is given in a hospital or clinic. It will not be stored at home. NOTE: This sheet is a summary. It may not cover all possible information. If you have questions about this medicine, talk to your doctor, pharmacist, or health care provider.  2021 Elsevier/Gold Standard (2018-11-07 09:13:00)  

## 2020-06-01 ENCOUNTER — Telehealth: Payer: Self-pay | Admitting: Hematology and Oncology

## 2020-06-01 NOTE — Telephone Encounter (Signed)
Scheduled per 4/25 los. Called pt and left a msg   

## 2020-06-21 ENCOUNTER — Ambulatory Visit (HOSPITAL_BASED_OUTPATIENT_CLINIC_OR_DEPARTMENT_OTHER): Payer: Medicare PPO

## 2020-07-06 ENCOUNTER — Telehealth: Payer: Self-pay | Admitting: Hematology and Oncology

## 2020-07-06 NOTE — Telephone Encounter (Signed)
R/s per provider PAL, per 4/25 los, pt aware

## 2020-07-28 ENCOUNTER — Other Ambulatory Visit: Payer: Self-pay | Admitting: *Deleted

## 2020-07-28 MED ORDER — LEVOTHYROXINE SODIUM 75 MCG PO TABS: 75.0000 ug | ORAL_TABLET | Freq: Every day | ORAL | 3 refills | Status: DC

## 2020-08-03 ENCOUNTER — Other Ambulatory Visit: Payer: Self-pay | Admitting: Hematology and Oncology

## 2020-08-03 DIAGNOSIS — C50919 Malignant neoplasm of unspecified site of unspecified female breast: Secondary | ICD-10-CM

## 2020-08-23 ENCOUNTER — Ambulatory Visit: Payer: Medicare PPO | Admitting: Hematology and Oncology

## 2020-08-23 ENCOUNTER — Other Ambulatory Visit: Payer: Medicare PPO

## 2020-08-23 ENCOUNTER — Ambulatory Visit: Payer: Medicare PPO

## 2020-08-25 ENCOUNTER — Other Ambulatory Visit: Payer: Self-pay | Admitting: Hematology and Oncology

## 2020-08-25 DIAGNOSIS — C50919 Malignant neoplasm of unspecified site of unspecified female breast: Secondary | ICD-10-CM

## 2020-08-29 NOTE — Progress Notes (Signed)
[facility] Follow up:    Patient, [name] (Inactive) No address on file   DIAGNOSIS: Cancer Staging No matching staging information was found for the patient.  SUMMARY OF ONCOLOGIC HISTORY: Oncology History  Metastatic breast cancer ([place])  09/25/2018 Initial Diagnosis   Right breast biopsy on 09/25/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    05/22/2019 PET scan   PET scan on 05/22/19 showed resolution of the multiple osseous metastatic lesions, right chest wall mass, and bilateral axillary lymphadenopathy.     Anti-estrogen oral therapy   Abemaciclib, Anastrozole and Zometa.  (Moved from [place])   09/10/2019 Genetic Testing   Negative genetic testing:  No pathogenic variants detected on the [facility] Common Hereditary Cancers panel. The report date is 09/10/2019.   The Common Hereditary Cancers Panel offered by [facility] includes sequencing and/or deletion duplication testing of the following 48 genes: APC, ATM, AXIN2, BARD1, BMPR1A, BRCA1, BRCA2, BRIP1, CDH1, CDK4, CDKN2A (p14ARF), CDKN2A (p16INK4a), CHEK2, CTNNA1, DICER1, EPCAM (Deletion/duplication testing only), GREM1 (promoter region deletion/duplication testing only), KIT, MEN1, MLH1, MSH2, MSH3, MSH6, MUTYH, NBN, NF1, NTHL1, PALB2, PDGFRA, PMS2, POLD1, POLE, PTEN, RAD50, RAD51C, RAD51D, RNF43, SDHB, SDHC, SDHD, SMAD4, SMARCA4. STK11, TP53, TSC1, TSC2, and VHL.  The following genes were evaluated for sequence changes only: SDHA and HOXB13 c.251G>A variant only.      CURRENT THERAPY: Anastrozole, Abemaciclib and Zometa  INTERVAL HISTORY: Norma Murray 70 y.o. female returns for evaluation of her metastatic breast cancer.  Her most recent PET scan on 05/19/2020 showed no progression of her cancer.    She continues on Anastrozole and Abemaciclib (at 151m BID).  She tolerates this well and has no issues.    She is due for Zometa today, however notes she typically doesn't receive  this unless she feels like she needs it.  She says that she will feel an increased achiness in her bones and get the Zometa and feel better.  She has no oral or dental issues today.  She does not want to receive the Zometa at this point.     Patient Active Problem List   Diagnosis Date Noted   Genetic testing 09/10/2019   Family history of breast cancer    Family history of bone cancer    Family history of lung cancer    Metastatic breast cancer ([place] 08/12/2019    has No Known Allergies.  MEDICAL HISTORY: Past Medical History:  Diagnosis Date   Family history of bone cancer    Family history of breast cancer    Family history of lung cancer     SURGICAL HISTORY: History reviewed. No pertinent surgical history.  SOCIAL HISTORY: Social History   Socioeconomic History   Marital status: Married    Spouse name: Not on file   Number of children: Not on file   Years of education: Not on file   Highest education level: Not on file  Occupational History   Not on file  Tobacco Use   Smoking status: Never   Smokeless tobacco: Never  Substance and Sexual Activity   Alcohol use: Not on file   Drug use: Not on file   Sexual activity: Not on file  Other Topics Concern   Not on file  Social History Narrative   Not on file   Social Determinants of Health   Financial Resource Strain: Not on file  Food Insecurity: Not on file  Transportation Needs: Not on file  Physical Activity:  Not on file  Stress: Not on file  Social Connections: Not on file  Intimate Partner Violence: Not on file    FAMILY HISTORY: Family History  Problem Relation Age of Onset   Breast cancer Maternal Aunt 51   Mesothelioma Maternal Uncle        worked on railroad   Cirrhosis Maternal Grandfather        liver   Breast cancer Maternal Aunt 50   Lung cancer Maternal Uncle 83       smoker    Review of Systems  Constitutional:  Positive for fatigue (mild fatigue). Negative for appetite change,  chills, fever and unexpected weight change.  HENT:   Negative for hearing loss, lump/mass and trouble swallowing.   Eyes:  Negative for eye problems and icterus.  Respiratory:  Negative for chest tightness, cough and shortness of breath.   Cardiovascular:  Negative for chest pain, leg swelling and palpitations.  Gastrointestinal:  Negative for abdominal distention, abdominal pain, constipation, diarrhea, nausea and vomiting.  Endocrine: Negative for hot flashes.  Genitourinary:  Negative for difficulty urinating.   Musculoskeletal:  Negative for arthralgias.  Skin:  Negative for itching and rash.  Neurological:  Negative for dizziness, extremity weakness, headaches and numbness.  Hematological:  Negative for adenopathy. Does not bruise/bleed easily.  Psychiatric/Behavioral:  Negative for depression. The patient is not nervous/anxious.      PHYSICAL EXAMINATION  ECOG PERFORMANCE STATUS: 1 - Symptomatic but completely ambulatory  Vitals:   08/30/20 0936  BP: 140/69  Pulse: 88  Resp: 18  Temp: (!) 97.4 F (36.3 C)  SpO2: 100%    Physical Exam Constitutional:      General: She is not in acute distress.    Appearance: Normal appearance. She is not toxic-appearing.  HENT:     Head: Normocephalic and atraumatic.  Eyes:     General: No scleral icterus. Cardiovascular:     Rate and Rhythm: Normal rate and regular rhythm.     Pulses: Normal pulses.     Heart sounds: Normal heart sounds.  Pulmonary:     Effort: Pulmonary effort is normal.     Breath sounds: Normal breath sounds.  Abdominal:     General: Abdomen is flat. Bowel sounds are normal. There is no distension.     Palpations: Abdomen is soft.     Tenderness: There is no abdominal tenderness.  Musculoskeletal:        General: No swelling.     Cervical back: Neck supple.  Lymphadenopathy:     Cervical: No cervical adenopathy.  Skin:    General: Skin is warm and dry.     Findings: No rash.  Neurological:      General: No focal deficit present.     Mental Status: She is alert.  Psychiatric:        Mood and Affect: Mood normal.        Behavior: Behavior normal.    LABORATORY DATA:  CBC    Component Value Date/Time   WBC 4.3 08/30/2020 0914   RBC 3.72 (L) 08/30/2020 0914   HGB 13.2 08/30/2020 0914   HCT 38.4 08/30/2020 0914   PLT 130 (L) 08/30/2020 0914   MCV 103.2 (H) 08/30/2020 0914   MCH 35.5 (H) 08/30/2020 0914   MCHC 34.4 08/30/2020 0914   RDW 13.2 08/30/2020 0914   LYMPHSABS 1.0 08/30/2020 0914   MONOABS 0.4 08/30/2020 0914   EOSABS 0.1 08/30/2020 0914   BASOSABS 0.1 08/30/2020 0914  CMP     Component Value Date/Time   NA 141 08/30/2020 0914   K 3.9 08/30/2020 0914   CL 110 08/30/2020 0914   CO2 22 08/30/2020 0914   GLUCOSE 90 08/30/2020 0914   BUN 18 08/30/2020 0914   CREATININE 1.03 (H) 08/30/2020 0914   CALCIUM 9.2 08/30/2020 0914   PROT 6.5 08/30/2020 0914   ALBUMIN 3.8 08/30/2020 0914   AST 25 08/30/2020 0914   ALT 28 08/30/2020 0914   ALKPHOS 55 08/30/2020 0914   BILITOT 0.8 08/30/2020 0914   GFRNONAA 59 (L) 08/30/2020 0914   GFRAA 53 (L) 10/10/2019 0810      ASSESSMENT and THERAPY PLAN:   Metastatic breast cancer ([place]) Right breast biopsy on 09/25/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  Patient moved from [place] because she could not tolerate the heat.   PET scan on 05/22/19 showed resolution of the multiple osseous metastatic lesions, right chest wall mass, and bilateral axillary lymphadenopathy.   Current treatment: Abemaciclib, anastrozole and Zometa    Toxicities: 1.  Elevated LFTs: resolved once Abemaciclib dose adjusted to 166m BID 2. leukopenia/neutropenia: improved    Bone metastases: On Zometa every 3 months PET CT scan 05/20/20: No evidence of recurrent breast cancer.  No evidence of metastatic disease in the chest abdomen pelvis.  Evidence of diffuse treated bone metastatic disease without  hypermetabolism.  DJackelyn Polinghas no clinical signs of breast cancer progression.  She continues on treatment with good tolerance.  I reviewed her labs with her in detail.  She will not receive Zometa today per her preference.  She will [place] feels like she needs it.    I recommended continued healthy diet and exercise.   I placed orders for her PET scan for 3 months from now.  She will f/u with Dr. GLindi Adieafterward with labs and office visit to review results.  She knows to call for any questions that may arise between now and her next appointment.  We are happy to see her sooner if needed.          Total encounter time: 20 minutes in face to face visit time, lab review, chart review, order entry, and documentation of the encounter.    LWilber Bihari NP 08/30/20 1:15 PM [facility] [id number]. [phone]   Fax. [phone] *Total Encounter Time as defined by the Centers for Medicare and Medicaid Services includes, in addition to the face-to-face time of a patient visit (documented in the note above) non-face-to-face time: obtaining and reviewing outside history, ordering and reviewing medications, tests or procedures, care coordination (communications with other health care professionals or caregivers) and documentation in the medical record.

## 2020-08-29 NOTE — Assessment & Plan Note (Addendum)
Right breast biopsy on 09/25/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 Patient moved from [place] because she could not tolerate the heat.  PET scan on 05/22/19 showed resolution of the multiple osseous metastatic lesions, right chest wall mass, and bilateral axillary lymphadenopathy.  Current treatment:Abemaciclib, anastrozole and Zometa  Toxicities: 1.Elevated LFTs:resolved once Abemaciclib dose adjusted to 133m BID 2.leukopenia/neutropenia: improved   Bone metastases: On Zometa every 3 months PET CT scan 05/20/20: No evidence of recurrent breast cancer. No evidence of metastatic disease in the chest abdomen pelvis. Evidence of diffuse treated bone metastatic disease without hypermetabolism.  DJackelyn Polinghas no clinical signs of breast cancer progression.  She continues on treatment with good tolerance.  I reviewed her labs with her in detail.  She will not receive Zometa today per her preference.  She will call [place] she feels like she needs it.    I recommended continued healthy diet and exercise.   I placed orders for her PET scan for 3 months from now.  She will f/u with Dr. GLindi Adieafterward with labs and office visit to review results.  She knows to call for any questions that may arise between now and her next appointment.  We are happy to see her sooner if needed.

## 2020-08-30 ENCOUNTER — Encounter: Payer: Self-pay | Admitting: Adult Health

## 2020-08-30 ENCOUNTER — Ambulatory Visit: Payer: Medicare PPO | Admitting: Hematology and Oncology

## 2020-08-30 ENCOUNTER — Other Ambulatory Visit: Payer: Medicare PPO

## 2020-08-30 ENCOUNTER — Inpatient Hospital Stay: Payer: Medicare PPO | Admitting: Adult Health

## 2020-08-30 ENCOUNTER — Inpatient Hospital Stay: Payer: Medicare PPO | Attending: Hematology and Oncology

## 2020-08-30 ENCOUNTER — Inpatient Hospital Stay: Payer: Medicare PPO

## 2020-08-30 ENCOUNTER — Other Ambulatory Visit: Payer: Self-pay

## 2020-08-30 ENCOUNTER — Ambulatory Visit: Payer: Medicare PPO

## 2020-08-30 DIAGNOSIS — C7951 Secondary malignant neoplasm of bone: Secondary | ICD-10-CM | POA: Insufficient documentation

## 2020-08-30 DIAGNOSIS — C50911 Malignant neoplasm of unspecified site of right female breast: Secondary | ICD-10-CM | POA: Diagnosis present

## 2020-08-30 DIAGNOSIS — C50919 Malignant neoplasm of unspecified site of unspecified female breast: Secondary | ICD-10-CM

## 2020-08-30 DIAGNOSIS — Z170 Estrogen receptor positive status [ER+]: Secondary | ICD-10-CM | POA: Insufficient documentation

## 2020-08-30 DIAGNOSIS — Z79899 Other long term (current) drug therapy: Secondary | ICD-10-CM | POA: Insufficient documentation

## 2020-08-30 LAB — CBC WITH DIFFERENTIAL (CANCER CENTER ONLY)
Abs Immature Granulocytes: 0.01 10*3/uL (ref 0.00–0.07)
Basophils Absolute: 0.1 10*3/uL (ref 0.0–0.1)
Basophils Relative: 2 %
Eosinophils Absolute: 0.1 10*3/uL (ref 0.0–0.5)
Eosinophils Relative: 3 %
HCT: 38.4 % (ref 36.0–46.0)
Hemoglobin: 13.2 g/dL (ref 12.0–15.0)
Immature Granulocytes: 0 %
Lymphocytes Relative: 23 %
Lymphs Abs: 1 10*3/uL (ref 0.7–4.0)
MCH: 35.5 pg — ABNORMAL HIGH (ref 26.0–34.0)
MCHC: 34.4 g/dL (ref 30.0–36.0)
MCV: 103.2 fL — ABNORMAL HIGH (ref 80.0–100.0)
Monocytes Absolute: 0.4 10*3/uL (ref 0.1–1.0)
Monocytes Relative: 10 %
Neutro Abs: 2.6 10*3/uL (ref 1.7–7.7)
Neutrophils Relative %: 62 %
Platelet Count: 130 10*3/uL — ABNORMAL LOW (ref 150–400)
RBC: 3.72 MIL/uL — ABNORMAL LOW (ref 3.87–5.11)
RDW: 13.2 % (ref 11.5–15.5)
WBC Count: 4.3 10*3/uL (ref 4.0–10.5)
nRBC: 0 % (ref 0.0–0.2)

## 2020-08-30 LAB — CMP (CANCER CENTER ONLY)
ALT: 28 U/L (ref 0–44)
AST: 25 U/L (ref 15–41)
Albumin: 3.8 g/dL (ref 3.5–5.0)
Alkaline Phosphatase: 55 U/L (ref 38–126)
Anion gap: 9 (ref 5–15)
BUN: 18 mg/dL (ref 8–23)
CO2: 22 mmol/L (ref 22–32)
Calcium: 9.2 mg/dL (ref 8.9–10.3)
Chloride: 110 mmol/L (ref 98–111)
Creatinine: 1.03 mg/dL — ABNORMAL HIGH (ref 0.44–1.00)
GFR, Estimated: 59 mL/min — ABNORMAL LOW (ref >60)
Glucose, Bld: 90 mg/dL (ref 70–99)
Potassium: 3.9 mmol/L (ref 3.5–5.1)
Sodium: 141 mmol/L (ref 135–145)
Total Bilirubin: 0.8 mg/dL (ref 0.3–1.2)
Total Protein: 6.5 g/dL (ref 6.5–8.1)

## 2020-08-31 LAB — THYROID PANEL WITH TSH
Free Thyroxine Index: 1.9 (ref 1.2–4.9)
T3 Uptake Ratio: 24 % (ref 24–39)
T4, Total: 8.1 ug/dL (ref 4.5–12.0)
TSH: 0.148 u[IU]/mL — ABNORMAL LOW (ref 0.450–4.500)

## 2020-09-08 ENCOUNTER — Telehealth: Payer: Self-pay

## 2020-09-08 DIAGNOSIS — E039 Hypothyroidism, unspecified: Secondary | ICD-10-CM

## 2020-09-08 MED ORDER — LEVOTHYROXINE SODIUM 50 MCG PO TABS: 50.0000 ug | ORAL_TABLET | Freq: Every day | ORAL | 2 refills | Status: DC

## 2020-09-08 NOTE — Telephone Encounter (Signed)
Per MD - Decrease dose of Synthroid to 92mg due to TSH level, and referral to endocrinologist.    RN notified patient, patient verbalized understanding and agreement.   Rx sent to pharmacy, referral placed per MD recommendations.

## 2020-09-17 ENCOUNTER — Other Ambulatory Visit: Payer: Self-pay | Admitting: Hematology and Oncology

## 2020-09-17 DIAGNOSIS — C50919 Malignant neoplasm of unspecified site of unspecified female breast: Secondary | ICD-10-CM

## 2020-09-22 ENCOUNTER — Other Ambulatory Visit: Payer: Self-pay | Admitting: Hematology and Oncology

## 2020-09-23 ENCOUNTER — Encounter: Payer: Self-pay | Admitting: Hematology and Oncology

## 2020-10-14 ENCOUNTER — Other Ambulatory Visit: Payer: Self-pay | Admitting: Hematology and Oncology

## 2020-10-14 DIAGNOSIS — C50919 Malignant neoplasm of unspecified site of unspecified female breast: Secondary | ICD-10-CM

## 2020-11-08 ENCOUNTER — Other Ambulatory Visit: Payer: Self-pay

## 2020-11-08 ENCOUNTER — Ambulatory Visit (HOSPITAL_COMMUNITY)
Admission: RE | Admit: 2020-11-08 | Discharge: 2020-11-08 | Disposition: A | Discharge status: Home / Self Care | Payer: Medicare PPO | Source: Ambulatory Visit | Attending: Adult Health | Admitting: Adult Health

## 2020-11-08 DIAGNOSIS — M47819 Spondylosis without myelopathy or radiculopathy, site unspecified: Secondary | ICD-10-CM | POA: Insufficient documentation

## 2020-11-08 DIAGNOSIS — C50919 Malignant neoplasm of unspecified site of unspecified female breast: Secondary | ICD-10-CM | POA: Diagnosis not present

## 2020-11-08 DIAGNOSIS — I700 Atherosclerosis of aorta: Secondary | ICD-10-CM | POA: Insufficient documentation

## 2020-11-08 DIAGNOSIS — E041 Nontoxic single thyroid nodule: Secondary | ICD-10-CM | POA: Insufficient documentation

## 2020-11-08 DIAGNOSIS — K5730 Diverticulosis of large intestine without perforation or abscess without bleeding: Secondary | ICD-10-CM | POA: Insufficient documentation

## 2020-11-08 DIAGNOSIS — C7951 Secondary malignant neoplasm of bone: Secondary | ICD-10-CM | POA: Diagnosis not present

## 2020-11-08 DIAGNOSIS — J320 Chronic maxillary sinusitis: Secondary | ICD-10-CM | POA: Insufficient documentation

## 2020-11-08 LAB — GLUCOSE, CAPILLARY: Glucose-Capillary: 106 mg/dL — ABNORMAL HIGH (ref 70–99)

## 2020-11-08 MED ORDER — FLUDEOXYGLUCOSE F - 18 (FDG) INJECTION
7.0000 | Freq: Once | INTRAVENOUS | Status: AC | PRN
  Administered 2020-11-08: 7.05 via INTRAVENOUS

## 2020-11-09 ENCOUNTER — Other Ambulatory Visit: Payer: Self-pay | Admitting: Hematology and Oncology

## 2020-11-09 DIAGNOSIS — C50919 Malignant neoplasm of unspecified site of unspecified female breast: Secondary | ICD-10-CM

## 2020-11-29 NOTE — Progress Notes (Signed)
Patient Care Team: Patient, [name] (Inactive) as PCP - General (General Practice)  DIAGNOSIS:    ICD-10-CM   1. Metastatic breast cancer ([place])  C50.919       SUMMARY OF ONCOLOGIC HISTORY: Oncology History  Metastatic breast cancer ([place])  09/25/2018 Initial Diagnosis   Right breast biopsy on 09/25/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    05/22/2019 PET scan   PET scan on 05/22/19 showed resolution of the multiple osseous metastatic lesions, right chest wall mass, and bilateral axillary lymphadenopathy.     Anti-estrogen oral therapy   Abemaciclib, Anastrozole and Zometa.  (Moved from [place])   09/10/2019 Genetic Testing   Negative genetic testing:  No pathogenic variants detected on the [facility] Common Hereditary Cancers panel. The report date is 09/10/2019.   The Common Hereditary Cancers Panel offered by [facility] includes sequencing and/or deletion duplication testing of the following 48 genes: APC, ATM, AXIN2, BARD1, BMPR1A, BRCA1, BRCA2, BRIP1, CDH1, CDK4, CDKN2A (p14ARF), CDKN2A (p16INK4a), CHEK2, CTNNA1, DICER1, EPCAM (Deletion/duplication testing only), GREM1 (promoter region deletion/duplication testing only), KIT, MEN1, MLH1, MSH2, MSH3, MSH6, MUTYH, NBN, NF1, NTHL1, PALB2, PDGFRA, PMS2, POLD1, POLE, PTEN, RAD50, RAD51C, RAD51D, RNF43, SDHB, SDHC, SDHD, SMAD4, SMARCA4. STK11, TP53, TSC1, TSC2, and VHL.  The following genes were evaluated for sequence changes only: SDHA and HOXB13 c.251G>A variant only.      CHIEF COMPLIANT: Follow-up of metastatic breast cancer   INTERVAL HISTORY: Norma Murray is a 70 y.o. with above-mentioned history of metastatic breast cancer currently on treatment with Abemaciclib, anastrozole, and Zometa. PET scan on 11/08/2020 showed widespread patchy confluent sclerotic osseous metastatic disease throughout the axial and proximal appendicular skeleton, unchanged on the low-dose CT images. She presents to the clinic  today for follow-up.  She is tolerating the treatment fairly well she does feel some stiffness in her joints but otherwise no new complaints or concerns.  Denies diarrhea.  ALLERGIES:  has No Known Allergies.  MEDICATIONS:  Current Outpatient Medications  Medication Sig Dispense Refill   anastrozole (ARIMIDEX) 1 MG tablet TAKE 1 TABLET BY MOUTH EVERY DAY 90 tablet 3   cyclobenzaprine (FLEXERIL) 10 MG tablet Take 1 tablet (10 mg total) by mouth 3 (three) times daily as needed for muscle spasms. 20 tablet 0   levothyroxine (SYNTHROID) 50 MCG tablet Take 1 tablet (50 mcg total) by mouth daily before breakfast. 30 tablet 2   VERZENIO 100 MG tablet TAKE 1 TABLET BY MOUTH TWICE DAILY 56 tablet 0   No current facility-administered medications for this visit.    PHYSICAL EXAMINATION: ECOG PERFORMANCE STATUS: 1 - Symptomatic but completely ambulatory  Vitals:   11/30/20 0809  BP: (!) 142/77  Pulse: 70  Resp: 18  Temp: 97.7 F (36.5 C)  SpO2: 100%   Filed Weights   11/30/20 0809  Weight: 143 lb 8 oz (65.1 kg)    BREAST: No palpable masses or nodules in either right or left breasts. No palpable axillary supraclavicular or infraclavicular adenopathy no breast tenderness or nipple discharge. (exam performed in the presence of a chaperone)  LABORATORY DATA:  I have reviewed the data as listed CMP Latest Ref Rng & Units 08/30/2020 05/31/2020 04/07/2020  Glucose 70 - 99 mg/dL 90 94 105(H)  BUN 8 - 23 mg/dL 18 16 17   Creatinine 0.44 - 1.00 mg/dL 1.03(H) 1.11(H) 1.17(H)  Sodium 135 - 145 mmol/L 141 140 141  Potassium 3.5 - 5.1 mmol/L 3.9 4.0 4.2  Chloride 98 -  111 mmol/L 110 108 109  CO2 22 - 32 mmol/L 22 24 25   Calcium 8.9 - 10.3 mg/dL 9.2 9.0 9.1  Total Protein 6.5 - 8.1 g/dL 6.5 6.4(L) 6.9  Total Bilirubin 0.3 - 1.2 mg/dL 0.8 0.7 0.5  Alkaline Phos 38 - 126 U/L 55 47 58  AST 15 - 41 U/L 25 28 19   ALT 0 - 44 U/L 28 26 23     Lab Results  Component Value Date   WBC 4.0 11/30/2020    HGB 13.3 11/30/2020   HCT 39.0 11/30/2020   MCV 102.9 (H) 11/30/2020   PLT 133 (L) 11/30/2020   NEUTROABS 2.4 11/30/2020    ASSESSMENT & PLAN:  Metastatic breast cancer ([place]) Right breast biopsy on 09/25/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  Patient moved from [place] because she could not tolerate the heat.   PET scan on 05/22/19 showed resolution of the multiple osseous metastatic lesions, right chest wall mass, and bilateral axillary lymphadenopathy.   Current treatment: Abemaciclib, anastrozole and Zometa    Toxicities: 1.  Elevated LFTs: resolved once Abemaciclib dose adjusted to 1109m BID 2. leukopenia/neutropenia: improved     Bone metastases: On Zometa every 3 months PET CT scan 11/08/2020: No evidence of recurrent breast cancer.  Evidence of diffuse treated bone metastatic disease without hypermetabolism. Monitoring closely for toxicities.  Return to clinic every 3 months with labs and follow-up    No orders of the defined types were placed in this encounter.  The patient has a good understanding of the overall plan. she agrees with it. she will call with any problems that may develop before the next visit here.  Total time spent: 20 mins including face to face time and time spent for planning, charting and coordination of care  VRulon Eisenmenger MD, MPH 11/30/2020  I, KThana Ates am acting as scribe for Dr. VNicholas Lose  I have reviewed the above documentation for accuracy and completeness, and I agree with the above.

## 2020-11-30 ENCOUNTER — Inpatient Hospital Stay: Payer: Medicare PPO | Attending: Hematology and Oncology

## 2020-11-30 ENCOUNTER — Inpatient Hospital Stay: Payer: Medicare PPO | Admitting: Hematology and Oncology

## 2020-11-30 ENCOUNTER — Inpatient Hospital Stay: Payer: Medicare PPO

## 2020-11-30 ENCOUNTER — Other Ambulatory Visit: Payer: Self-pay

## 2020-11-30 DIAGNOSIS — E039 Hypothyroidism, unspecified: Secondary | ICD-10-CM

## 2020-11-30 DIAGNOSIS — Z79811 Long term (current) use of aromatase inhibitors: Secondary | ICD-10-CM | POA: Diagnosis not present

## 2020-11-30 DIAGNOSIS — C7951 Secondary malignant neoplasm of bone: Secondary | ICD-10-CM | POA: Insufficient documentation

## 2020-11-30 DIAGNOSIS — C50911 Malignant neoplasm of unspecified site of right female breast: Secondary | ICD-10-CM | POA: Diagnosis not present

## 2020-11-30 DIAGNOSIS — C50919 Malignant neoplasm of unspecified site of unspecified female breast: Secondary | ICD-10-CM | POA: Diagnosis not present

## 2020-11-30 DIAGNOSIS — E041 Nontoxic single thyroid nodule: Secondary | ICD-10-CM | POA: Diagnosis not present

## 2020-11-30 DIAGNOSIS — Z170 Estrogen receptor positive status [ER+]: Secondary | ICD-10-CM | POA: Insufficient documentation

## 2020-11-30 DIAGNOSIS — N3000 Acute cystitis without hematuria: Secondary | ICD-10-CM | POA: Insufficient documentation

## 2020-11-30 LAB — CMP (CANCER CENTER ONLY)
ALT: 30 U/L (ref 0–44)
AST: 25 U/L (ref 15–41)
Albumin: 4.1 g/dL (ref 3.5–5.0)
Alkaline Phosphatase: 72 U/L (ref 38–126)
Anion gap: 10 (ref 5–15)
BUN: 18 mg/dL (ref 8–23)
CO2: 19 mmol/L — ABNORMAL LOW (ref 22–32)
Calcium: 9.6 mg/dL (ref 8.9–10.3)
Chloride: 111 mmol/L (ref 98–111)
Creatinine: 1.03 mg/dL — ABNORMAL HIGH (ref 0.44–1.00)
GFR, Estimated: 59 mL/min — ABNORMAL LOW (ref >60)
Glucose, Bld: 85 mg/dL (ref 70–99)
Potassium: 4.1 mmol/L (ref 3.5–5.1)
Sodium: 140 mmol/L (ref 135–145)
Total Bilirubin: 0.7 mg/dL (ref 0.3–1.2)
Total Protein: 6.9 g/dL (ref 6.5–8.1)

## 2020-11-30 LAB — CBC WITH DIFFERENTIAL (CANCER CENTER ONLY)
Abs Immature Granulocytes: 0.02 10*3/uL (ref 0.00–0.07)
Basophils Absolute: 0.1 10*3/uL (ref 0.0–0.1)
Basophils Relative: 2 %
Eosinophils Absolute: 0.1 10*3/uL (ref 0.0–0.5)
Eosinophils Relative: 4 %
HCT: 39 % (ref 36.0–46.0)
Hemoglobin: 13.3 g/dL (ref 12.0–15.0)
Immature Granulocytes: 1 %
Lymphocytes Relative: 24 %
Lymphs Abs: 1 10*3/uL (ref 0.7–4.0)
MCH: 35.1 pg — ABNORMAL HIGH (ref 26.0–34.0)
MCHC: 34.1 g/dL (ref 30.0–36.0)
MCV: 102.9 fL — ABNORMAL HIGH (ref 80.0–100.0)
Monocytes Absolute: 0.4 10*3/uL (ref 0.1–1.0)
Monocytes Relative: 10 %
Neutro Abs: 2.4 10*3/uL (ref 1.7–7.7)
Neutrophils Relative %: 59 %
Platelet Count: 133 10*3/uL — ABNORMAL LOW (ref 150–400)
RBC: 3.79 MIL/uL — ABNORMAL LOW (ref 3.87–5.11)
RDW: 13.2 % (ref 11.5–15.5)
WBC Count: 4 10*3/uL (ref 4.0–10.5)
nRBC: 0 % (ref 0.0–0.2)

## 2020-11-30 MED ORDER — SODIUM CHLORIDE 0.9 % IV SOLN: INTRAVENOUS | Status: DC

## 2020-11-30 MED ORDER — ZOLEDRONIC ACID 4 MG/100ML IV SOLN
4.0000 mg | Freq: Once | INTRAVENOUS | Status: AC
  Administered 2020-11-30: 4 mg via INTRAVENOUS
  Filled 2020-11-30: qty 100

## 2020-11-30 NOTE — Assessment & Plan Note (Signed)
Right breast biopsy on 09/25/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 Patient moved from [place] because she could not tolerate the heat.  PET scan on 05/22/19 showed resolution of the multiple osseous metastatic lesions, right chest wall mass, and bilateral axillary lymphadenopathy.  Current treatment:Abemaciclib, anastrozole and Zometa  Toxicities: 1.Elevated LFTs:resolved once Abemaciclib dose adjusted to 164m BID 2.leukopenia/neutropenia: improved   Bone metastases: On Zometaevery 3 months PET CT scan 11/08/2020: No evidence of recurrent breast cancer. Evidence of diffuse treated bone metastatic disease without hypermetabolism.  Return to clinic every 3 months with labs and follow-up

## 2020-11-30 NOTE — Patient Instructions (Signed)

## 2020-12-01 LAB — THYROID PANEL WITH TSH
Free Thyroxine Index: 1.8 (ref 1.2–4.9)
T3 Uptake Ratio: 24 % (ref 24–39)
T4, Total: 7.4 ug/dL (ref 4.5–12.0)
TSH: 2.36 u[IU]/mL (ref 0.450–4.500)

## 2020-12-03 ENCOUNTER — Other Ambulatory Visit: Payer: Self-pay | Admitting: Hematology and Oncology

## 2020-12-03 DIAGNOSIS — C50919 Malignant neoplasm of unspecified site of unspecified female breast: Secondary | ICD-10-CM

## 2020-12-04 ENCOUNTER — Other Ambulatory Visit: Payer: Self-pay | Admitting: Hematology and Oncology

## 2020-12-06 ENCOUNTER — Other Ambulatory Visit: Payer: Self-pay | Admitting: *Deleted

## 2020-12-06 ENCOUNTER — Other Ambulatory Visit: Payer: Self-pay

## 2020-12-06 ENCOUNTER — Inpatient Hospital Stay (HOSPITAL_BASED_OUTPATIENT_CLINIC_OR_DEPARTMENT_OTHER): Payer: Medicare PPO | Admitting: Physician Assistant

## 2020-12-06 ENCOUNTER — Inpatient Hospital Stay: Payer: Medicare PPO

## 2020-12-06 ENCOUNTER — Encounter: Payer: Self-pay | Admitting: Hematology and Oncology

## 2020-12-06 VITALS — BP 150/90 | HR 77 | Temp 97.9°F | Resp 16 | Ht 65.5 in | Wt 143.6 lb

## 2020-12-06 DIAGNOSIS — C50911 Malignant neoplasm of unspecified site of right female breast: Secondary | ICD-10-CM | POA: Diagnosis not present

## 2020-12-06 DIAGNOSIS — R300 Dysuria: Secondary | ICD-10-CM

## 2020-12-06 DIAGNOSIS — N3000 Acute cystitis without hematuria: Secondary | ICD-10-CM | POA: Diagnosis not present

## 2020-12-06 DIAGNOSIS — C50919 Malignant neoplasm of unspecified site of unspecified female breast: Secondary | ICD-10-CM | POA: Diagnosis not present

## 2020-12-06 DIAGNOSIS — Z780 Asymptomatic menopausal state: Secondary | ICD-10-CM

## 2020-12-06 LAB — URINALYSIS, COMPLETE (UACMP) WITH MICROSCOPIC
Bilirubin Urine: NEGATIVE
Glucose, UA: NEGATIVE mg/dL
Hgb urine dipstick: NEGATIVE
Ketones, ur: NEGATIVE mg/dL
Nitrite: POSITIVE — AB
Protein, ur: NEGATIVE mg/dL
Specific Gravity, Urine: 1.012 (ref 1.005–1.030)
pH: 5 (ref 5.0–8.0)

## 2020-12-06 MED ORDER — CEPHALEXIN 500 MG PO CAPS: 500.0000 mg | ORAL_CAPSULE | Freq: Two times a day (BID) | ORAL | 0 refills | Status: DC

## 2020-12-06 NOTE — Progress Notes (Signed)
Symptom Management Consult note [place]   Telephone:(336) [phone] Fax:(336) [phone]    Patient Care Team: Patient, [name] (Inactive) as PCP - General (General Practice)    Name of the patient: Norma Murray  [id number]  04-Apr-1950   Date of visit: 12/06/2020    Chief complaint/ Reason for visit- pelvic pressure with urination  Oncology History  Metastatic breast cancer ([place])  09/25/2018 Initial Diagnosis   Right breast biopsy on 09/25/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    05/22/2019 PET scan   PET scan on 05/22/19 showed resolution of the multiple osseous metastatic lesions, right chest wall mass, and bilateral axillary lymphadenopathy.     Anti-estrogen oral therapy   Abemaciclib, Anastrozole and Zometa.  (Moved from [place])   09/10/2019 Genetic Testing   Negative genetic testing:  No pathogenic variants detected on the [facility] Common Hereditary Cancers panel. The report date is 09/10/2019.   The Common Hereditary Cancers Panel offered by [facility] includes sequencing and/or deletion duplication testing of the following 48 genes: APC, ATM, AXIN2, BARD1, BMPR1A, BRCA1, BRCA2, BRIP1, CDH1, CDK4, CDKN2A (p14ARF), CDKN2A (p16INK4a), CHEK2, CTNNA1, DICER1, EPCAM (Deletion/duplication testing only), GREM1 (promoter region deletion/duplication testing only), KIT, MEN1, MLH1, MSH2, MSH3, MSH6, MUTYH, NBN, NF1, NTHL1, PALB2, PDGFRA, PMS2, POLD1, POLE, PTEN, RAD50, RAD51C, RAD51D, RNF43, SDHB, SDHC, SDHD, SMAD4, SMARCA4. STK11, TP53, TSC1, TSC2, and VHL.  The following genes were evaluated for sequence changes only: SDHA and HOXB13 c.251G>A variant only.      Current Therapy: Anastrozole, Abemaciclib and Zometa  Interval history- Norma Murray is a 70 yo female with history of metastatic breast cancer presenting to [facility] today with chief complaint of pelvic pressure and urinary frequency x 1 week. Symptoms have been progressively  worsening.  Patient admits to history of prolapsed bladder that was diagnosed when she lived in [place] approximately 8 years ago.  She denies any history of surgical intervention for this, states she will occasionally get urinary tract infections although has not had one in over 1 year.  Patient denies being sexually active in several years.  She describes her symptoms today as feeling like she has increased pelvic pressure that is intermittent and worse when using the bathroom.  She denies any vaginal discharge.  She did notice some light blood on the toilet paper after wiping however denies any persistent vaginal bleeding.  Denies any fever, chills, nausea, vomiting, flank pain, dysuria, abdominal pain.    ROS  All other systems are reviewed and are negative for acute change except as noted in the HPI.    No Known Allergies   Past Medical History:  Diagnosis Date   Family history of bone cancer    Family history of breast cancer    Family history of lung cancer      No past surgical history on file.  Social History   Socioeconomic History   Marital status: Married    Spouse name: Not on file   Number of children: Not on file   Years of education: Not on file   Highest education level: Not on file  Occupational History   Not on file  Tobacco Use   Smoking status: Never   Smokeless tobacco: Never  Substance and Sexual Activity   Alcohol use: Not on file   Drug use: Not on file   Sexual activity: Not on file  Other Topics Concern   Not on file  Social History Narrative  Not on file   Social Determinants of Health   Financial Resource Strain: Not on file  Food Insecurity: Not on file  Transportation Needs: Not on file  Physical Activity: Not on file  Stress: Not on file  Social Connections: Not on file  Intimate Partner Violence: Not on file    Family History  Problem Relation Age of Onset   Breast cancer Maternal Aunt 30   Mesothelioma Maternal Uncle         worked on railroad   Cirrhosis Maternal Grandfather        liver   Breast cancer Maternal Aunt 95   Lung cancer Maternal Uncle 83       smoker     Current Outpatient Medications:    anastrozole (ARIMIDEX) 1 MG tablet, TAKE 1 TABLET BY MOUTH EVERY DAY, Disp: 90 tablet, Rfl: 3   cephALEXin (KEFLEX) 500 MG capsule, Take 1 capsule (500 mg total) by mouth 2 (two) times daily for 7 days., Disp: 14 capsule, Rfl: 0   cyclobenzaprine (FLEXERIL) 10 MG tablet, Take 1 tablet (10 mg total) by mouth 3 (three) times daily as needed for muscle spasms., Disp: 20 tablet, Rfl: 0   levothyroxine (SYNTHROID) 50 MCG tablet, TAKE 1 TABLET BY MOUTH DAILY BEFORE BREAKFAST, Disp: 90 tablet, Rfl: 0   VERZENIO 100 MG tablet, TAKE 1 TABLET BY MOUTH TWICE DAILY, Disp: 56 tablet, Rfl: 0  PHYSICAL EXAM: ECOG FS:1 - Symptomatic but completely ambulatory    Vitals:   12/06/20 1104  BP: (!) 150/90  Pulse: 77  Resp: 16  Temp: 97.9 F (36.6 C)  TempSrc: Temporal  SpO2: 100%  Weight: 143 lb 9.6 oz (65.1 kg)  Height: 5' 5.5" (1.664 m)   Physical Exam   GENERAL:alert, no distress and comfortable SKIN: skin color, texture, turgor are normal, no rashes or significant lesions EYES: normal, Conjunctiva are pink and non-injected, sclera clear OROPHARYNX:no exudate, no erythema and lips, buccal mucosa, and tongue normal  NECK: supple, thyroid normal size, non-tender, without nodularity LYMPH:  no palpable lymphadenopathy in the cervical, axillary or inguinal LUNGS: clear to auscultation and percussion with normal breathing effort HEART: regular rate & rhythm and no murmurs and no lower extremity edema ABDOMEN:abdomen soft, non-tender and normal bowel sounds. No CVA tenderness. Musculoskeletal:no cyanosis of digits and no clubbing  NEURO: alert & oriented x 3 with fluent speech, no focal motor/sensory deficits   LABORATORY DATA: I have reviewed the data as listed CBC Latest Ref Rng & Units 11/30/2020 08/30/2020  05/31/2020  WBC 4.0 - 10.5 K/uL 4.0 4.3 3.4(L)  Hemoglobin 12.0 - 15.0 g/dL 13.3 13.2 13.2  Hematocrit 36.0 - 46.0 % 39.0 38.4 40.1  Platelets 150 - 400 K/uL 133(L) 130(L) 140(L)     CMP Latest Ref Rng & Units 11/30/2020 08/30/2020 05/31/2020  Glucose 70 - 99 mg/dL 85 90 94  BUN 8 - 23 mg/dL _0 Creatinine 0.44 - 1.00 mg/dL 1.03(H) 1.03(H) 1.11(H)  Sodium 135 - 145 mmol/L 140 141 140  Potassium 3.5 - 5.1 mmol/L 4.1 3.9 4.0  Chloride 98 - 111 mmol/L 111 110 108  CO2 22 - 32 mmol/L 19(L) 22 24  Calcium 8.9 - 10.3 mg/dL 9.6 9.2 9.0  Total Protein 6.5 - 8.1 g/dL 6.9 6.5 6.4(L)  Total Bilirubin 0.3 - 1.2 mg/dL 0.7 0.8 0.7  Alkaline Phos 38 - 126 U/L 72 55 47  AST 15 - 41 U/L _1 ALT 0 - 44 U/L  _0 RADIOGRAPHIC STUDIES: I have personally reviewed the radiological images as listed and agreed with the findings in the report. No images are attached to the encounter. NM PET Image Restag (PS) Skull Base To Thigh  Result Date: 11/08/2020 CLINICAL DATA:  Subsequent treatment strategy for metastatic right breast cancer diagnosed 09/25/2018, assess treatment response. EXAM: NUCLEAR MEDICINE PET SKULL BASE TO THIGH TECHNIQUE: 7.1 mCi F-18 FDG was injected intravenously. Full-ring PET imaging was performed from the skull base to thigh after the radiotracer. CT data was obtained and used for attenuation correction and anatomic localization. Fasting blood glucose: 106 mg/dl COMPARISON:  05/19/2020 PET-CT. FINDINGS: Mediastinal blood pool activity: SUV max 2.8 Liver activity: SUV max NA NECK: No hypermetabolic lymph nodes in the neck. Diffuse thyroid hypermetabolism is unchanged. Densely calcified subcentimeter left thyroid nodule is unchanged. No additional discrete thyroid nodules. Incidental CT findings: Mild mucoperiosteal thickening in the inferior maxillary sinuses, unchanged. CHEST: No enlarged or hypermetabolic axillary, mediastinal or hilar lymph nodes. No hypermetabolic  pulmonary findings. Incidental CT findings: Mildly atherosclerotic nonaneurysmal thoracic aorta. No significant pulmonary nodules. ABDOMEN/PELVIS: No abnormal hypermetabolic activity within the liver, pancreas, adrenal glands, or spleen. No hypermetabolic lymph nodes in the abdomen or pelvis. Incidental CT findings: Moderate sigmoid diverticulosis. SKELETON: Widespread patchy confluent sclerotic osseous lesions throughout spine, sternum, bilateral ribs, sacrum and bilateral pelvic girdle see, not appreciably changed on the low-dose CT images. No focal skeletal hypermetabolism highly suspicious for metastatic disease. Similar hypermetabolism associated with degenerative changes at the bilateral L5-S1 facet joints, although in a slightly different distribution with new focal uptake at the medial margin of the right L5-S1 facet joint, without discrete CT correlate, favoring degenerative uptake. Incidental CT findings: none IMPRESSION: 1. No definite findings of recurrent hypermetabolic metastatic disease. No lymphadenopathy. 2. Widespread patchy confluent sclerotic osseous metastatic disease throughout the axial and proximal appendicular skeleton, unchanged on the low-dose CT images. No focal skeletal hypermetabolism highly suspicious for metastatic disease. Similar hypermetabolism at the bilateral L5-S1 facet joints associated with advanced degenerative facet arthropathy, although in a slightly different distribution with new focal uptake at the medial margin of the right L5-S1 facet joint, without discrete CT correlate, favoring degenerative uptake. Suggest continued attention on follow-up PET-CT studies. 3. Chronic findings include: Aortic Atherosclerosis (ICD10-I70.0). Mild chronic bilateral maxillary sinusitis. Moderate sigmoid diverticulosis. Diffuse thyroid hypermetabolism without discrete thyroid nodule, unchanged from prior. Suggest correlation with thyroid function tests. Electronically Signed   By: Ilona Sorrel M.D.   On: 11/08/2020 15:37     ASSESSMENT & PLAN: Patient is a 70 y.o. female with history of metastatic breast cancer currently on anastrozole, abemaciclib and zometa followed by oncologist Dr. Lindi Adie.   #)Pelvic pressure with urination and frequency- Patient with history of prolapsed bladder, not currently followed by any local urologist.  Abdomen is nontender on exam and patient is well-appearing.  No CVA tenderness.  Low suspicion for pyelonephritis given reassuring vital signs as well.  UA is concerning for signs of infection with positive nitrites, bacteria and presence of WBC.  Urine culture collected.  Patient has taken Keflex in the past with treatment success, so it was prescribed today.  She has not had a UTI in at least 1 year.  Offered prescription for Pyridium and [facility] referral however patient politely declined both.  Per patient she would rather discuss this further with oncologist Dr. Lindi Adie.  Discussed with worsening symptoms and strict return precautions as well as ER  precautions.  #)Metastatic breast cancer- continue treatment pre primary oncologist.   Visit Diagnosis: 1. Acute cystitis without hematuria   2. Metastatic breast cancer ([place])      No orders of the defined types were placed in this encounter.   All questions were answered. The patient knows to call the clinic with any problems, questions or concerns. No barriers to learning was detected.  I have spent a total of 20 minutes minutes of face-to-face and non-face-to-face time, preparing to see the patient, obtaining and/or reviewing separately obtained history, performing a medically appropriate examination, counseling and educating the patient, ordering tests,  documenting clinical information in the electronic health record, and care coordination.     Thank you for allowing me to participate in the care of this patient.    Barrie Folk, PA-C Department of Hematology/Oncology [place] at [facility] Phone: [phone]   12/06/2020 12:28 PM

## 2020-12-06 NOTE — Progress Notes (Signed)
Received call from pt with complaint of burring with urination and frequency as well as a foul odor.  Per MD pt needing to come in for UA and culture for further evaluation.  Pt verbalized understanding. Orders placed and appt scheduled.

## 2020-12-07 ENCOUNTER — Telehealth: Payer: Self-pay

## 2020-12-07 NOTE — Telephone Encounter (Signed)
Oral Oncology Patient Advocate Encounter  Met patient in [place] to complete a re-enrollment application for [employer] Patient Assistance Program in an effort to reduce the patient's out of pocket expense for Verzenio to $0.    Application completed and faxed to [phone].   [employer] patient assistance phone number for follow up is [phone].   This encounter will be updated until final determination.     [place] Patient [place] Phone [phone] Fax [phone] 12/07/2020 12:00 PM

## 2020-12-08 ENCOUNTER — Other Ambulatory Visit (HOSPITAL_COMMUNITY): Payer: Self-pay

## 2020-12-08 ENCOUNTER — Other Ambulatory Visit: Payer: Self-pay | Admitting: *Deleted

## 2020-12-08 ENCOUNTER — Other Ambulatory Visit: Payer: Self-pay | Admitting: Hematology and Oncology

## 2020-12-08 ENCOUNTER — Encounter: Payer: Self-pay | Admitting: Hematology and Oncology

## 2020-12-08 LAB — URINE CULTURE: Culture: >=100000 — AB

## 2020-12-08 MED ORDER — AMOXICILLIN-POT CLAVULANATE 875-125 MG PO TABS
1.0000 | ORAL_TABLET | Freq: Two times a day (BID) | ORAL | 0 refills | Status: DC
  Filled 2020-12-08: qty 14, 7d supply, fill #0

## 2020-12-08 MED ORDER — AMOXICILLIN-POT CLAVULANATE 875-125 MG PO TABS: 1.0000 | ORAL_TABLET | Freq: Two times a day (BID) | ORAL | 0 refills | Status: DC

## 2020-12-08 NOTE — Progress Notes (Signed)
Received call from pt with complaint of urine frequency and bladder spasms while on Keflex for UTI.  MD reviewed UA culture and verbal orders received for pt to start Augmentin [phone] BID x7 day.  Prescription sent to pharmacy on file.  Pt educated on abx as well as OTC pyridium.  Pt verbalized understanding.

## 2020-12-20 ENCOUNTER — Other Ambulatory Visit: Payer: Self-pay | Admitting: *Deleted

## 2020-12-20 DIAGNOSIS — R8290 Unspecified abnormal findings in urine: Secondary | ICD-10-CM

## 2020-12-20 DIAGNOSIS — C50919 Malignant neoplasm of unspecified site of unspecified female breast: Secondary | ICD-10-CM

## 2020-12-20 NOTE — Progress Notes (Signed)
Received call from pt with complaint of hematuria after lifting a large case of water.  Pt states she has a hx of prolapsed bladder and requesting advice from MD.  Per MD pt needing to be referred to [facility] for further evaluation and tx.  Pt verbalized understanding and RN successfully faxed referral to [phone].

## 2021-01-10 ENCOUNTER — Other Ambulatory Visit: Payer: Self-pay | Admitting: Hematology and Oncology

## 2021-01-10 DIAGNOSIS — C50919 Malignant neoplasm of unspecified site of unspecified female breast: Secondary | ICD-10-CM

## 2021-01-11 ENCOUNTER — Telehealth: Payer: Self-pay | Admitting: *Deleted

## 2021-01-11 ENCOUNTER — Other Ambulatory Visit: Payer: Self-pay | Admitting: *Deleted

## 2021-01-11 ENCOUNTER — Encounter: Payer: Self-pay | Admitting: *Deleted

## 2021-01-11 DIAGNOSIS — C50919 Malignant neoplasm of unspecified site of unspecified female breast: Secondary | ICD-10-CM

## 2021-01-11 MED ORDER — ABEMACICLIB 100 MG PO TABS: 100.0000 mg | ORAL_TABLET | Freq: Two times a day (BID) | ORAL | 0 refills | Status: DC

## 2021-01-11 NOTE — Progress Notes (Signed)
Received call from Pharmacist with [employer] requesting verbal orders for pt Verzenio.  States electronic prescription is not going through in their system.  Verbal orders given per MD request, pharmacist verified orders and states medication will be sent ASAP.

## 2021-01-11 NOTE — Telephone Encounter (Signed)
error 

## 2021-01-18 ENCOUNTER — Other Ambulatory Visit: Payer: Self-pay | Admitting: Hematology and Oncology

## 2021-01-18 DIAGNOSIS — C50919 Malignant neoplasm of unspecified site of unspecified female breast: Secondary | ICD-10-CM

## 2021-01-18 MED ORDER — ABEMACICLIB 100 MG PO TABS: 100.0000 mg | ORAL_TABLET | Freq: Two times a day (BID) | ORAL | 11 refills | Status: DC

## 2021-02-16 NOTE — Telephone Encounter (Signed)
Patient denied re-enrollment due to not meeting income requirements.  [place] Patient [place] Phone [phone] Fax [phone] 02/16/2021 9:40 AM

## 2021-03-01 ENCOUNTER — Other Ambulatory Visit: Payer: Self-pay | Admitting: *Deleted

## 2021-03-01 DIAGNOSIS — C50919 Malignant neoplasm of unspecified site of unspecified female breast: Secondary | ICD-10-CM

## 2021-03-01 NOTE — Assessment & Plan Note (Signed)
Right breast biopsy on 09/25/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 Patient moved from [place] because she could not tolerate the heat.  Current treatment:Abemaciclib, anastrozole and Zometa  Toxicities: 1.Elevated LFTs:We reducedthe dosage to100 mg p.o. twice daily. She is tolerating it extremely well with normal LFTs. 2.leukopenia/neutropenia: ANC2 3.Thrombocytopenia: Platelets88: I suspect this is related to COVID-19 and its treatment. We will continue with the same dosing. COVID-19 infection January 2022: Treated with antibiotic treatments.  Bone metastases: On Zometaevery 3 months PET CT scan 11/08/20: No evidence of recurrent breast cancer. No evidence of metastatic disease in the chest abdomen pelvis. Evidence of diffuse treated bone metastatic disease without hypermetabolism.  Hypothyroidism: on thyroid medication.  We will try to add TSH to today's labs.  Continue current treatment RTC in 3 months with labs and Zometa

## 2021-03-01 NOTE — Progress Notes (Signed)
Patient Care Team: Patient, [name] (Inactive) as PCP - General (General Practice)  DIAGNOSIS:    ICD-10-CM   1. Metastatic breast cancer ([place])  C50.919 NM PET Image Restag (PS) Skull Base To Thigh      SUMMARY OF ONCOLOGIC HISTORY: Oncology History  Metastatic breast cancer ([place])  09/25/2018 Initial Diagnosis   Right breast biopsy on 09/25/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    05/22/2019 PET scan   PET scan on 05/22/19 showed resolution of the multiple osseous metastatic lesions, right chest wall mass, and bilateral axillary lymphadenopathy.     Anti-estrogen oral therapy   Abemaciclib, Anastrozole and Zometa.  (Moved from [place])   09/10/2019 Genetic Testing   Negative genetic testing:  No pathogenic variants detected on the [facility] Common Hereditary Cancers panel. The report date is 09/10/2019.   The Common Hereditary Cancers Panel offered by [facility] includes sequencing and/or deletion duplication testing of the following 48 genes: APC, ATM, AXIN2, BARD1, BMPR1A, BRCA1, BRCA2, BRIP1, CDH1, CDK4, CDKN2A (p14ARF), CDKN2A (p16INK4a), CHEK2, CTNNA1, DICER1, EPCAM (Deletion/duplication testing only), GREM1 (promoter region deletion/duplication testing only), KIT, MEN1, MLH1, MSH2, MSH3, MSH6, MUTYH, NBN, NF1, NTHL1, PALB2, PDGFRA, PMS2, POLD1, POLE, PTEN, RAD50, RAD51C, RAD51D, RNF43, SDHB, SDHC, SDHD, SMAD4, SMARCA4. STK11, TP53, TSC1, TSC2, and VHL.  The following genes were evaluated for sequence changes only: SDHA and HOXB13 c.251G>A variant only.      CHIEF COMPLIANT: Follow-up of metastatic breast cancer   INTERVAL HISTORY: Norma Murray is a 71 y.o. with above-mentioned history of metastatic breast cancer currently on treatment with Abemaciclib, anastrozole, and Zometa. She presents to the clinic today for follow-up.  She had longstanding urinary tract infection which finally cleared up.  She will have to undergo cystoscopy and a CT scan.   She is tolerating the Verzenio at the current dosage extremely well.  Only complaint is joint stiffness related to anastrozole especially in her hands.  ALLERGIES:  has No Known Allergies.  MEDICATIONS:  Current Outpatient Medications  Medication Sig Dispense Refill   abemaciclib (VERZENIO) 100 MG tablet Take 1 tablet (100 mg total) by mouth 2 (two) times daily. Swallow tablets whole. Do not chew, crush, or split tablets before swallowing. 56 tablet 11   amoxicillin-clavulanate (AUGMENTIN) 875-125 MG tablet Take 1 tablet by mouth 2 (two) times daily. 14 tablet 0   anastrozole (ARIMIDEX) 1 MG tablet TAKE 1 TABLET BY MOUTH EVERY DAY 90 tablet 3   cyclobenzaprine (FLEXERIL) 10 MG tablet Take 1 tablet (10 mg total) by mouth 3 (three) times daily as needed for muscle spasms. 20 tablet 0   levothyroxine (SYNTHROID) 50 MCG tablet TAKE 1 TABLET BY MOUTH DAILY BEFORE BREAKFAST 90 tablet 0   No current facility-administered medications for this visit.    PHYSICAL EXAMINATION: ECOG PERFORMANCE STATUS: 1 - Symptomatic but completely ambulatory  Vitals:   03/02/21 0829  BP: 134/77  Pulse: 79  Resp: 18  Temp: 97.7 F (36.5 C)  SpO2: 100%   Filed Weights   03/02/21 0829  Weight: 144 lb 8 oz (65.5 kg)       LABORATORY DATA:  I have reviewed the data as listed CMP Latest Ref Rng & Units 03/02/2021 11/30/2020 08/30/2020  Glucose 70 - 99 mg/dL 94 85 90  BUN 8 - 23 mg/dL 16 18 18   Creatinine 0.44 - 1.00 mg/dL 1.12(H) 1.03(H) 1.03(H)  Sodium 135 - 145 mmol/L 139 140 141  Potassium 3.5 - 5.1 mmol/L 4.3  4.1 3.9  Chloride 98 - 111 mmol/L 106 111 110  CO2 22 - 32 mmol/L 26 19(L) 22  Calcium 8.9 - 10.3 mg/dL 9.3 9.6 9.2  Total Protein 6.5 - 8.1 g/dL 6.8 6.9 6.5  Total Bilirubin 0.3 - 1.2 mg/dL 0.7 0.7 0.8  Alkaline Phos 38 - 126 U/L 68 72 55  AST 15 - 41 U/L 23 25 25   ALT 0 - 44 U/L 26 30 28     Lab Results  Component Value Date   WBC 4.8 03/02/2021   HGB 13.4 03/02/2021   HCT 39.8  03/02/2021   MCV 103.4 (H) 03/02/2021   PLT 136 (L) 03/02/2021   NEUTROABS 3.0 03/02/2021    ASSESSMENT & PLAN:  Metastatic breast cancer ([facility]) Right breast biopsy on 09/25/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  Patient moved from [place] because she could not tolerate the heat.   Current treatment: Abemaciclib, anastrozole and Zometa    Toxicities: 1.  Elevated LFTs: We reduced the dosage to 100 mg p.o. twice daily.  She is tolerating it extremely well with normal LFTs. 2. leukopenia/neutropenia: ANC 2 3.  Thrombocytopenia: Platelets 88: I suspect this is related to COVID-19 and its treatment.  We will continue with the same dosing. COVID-19 infection January 2022: Treated with antibiotic treatments.   Bone metastases: On Zometa every 3 months PET CT scan 11/08/20: No evidence of recurrent breast cancer.  No evidence of metastatic disease in the chest abdomen pelvis.  Evidence of diffuse treated bone metastatic disease without hypermetabolism.   Hypothyroidism: on thyroid medication.  We will try to add TSH to today's labs.   Continue current treatment RTC in 3 months with labs and Zometa after PET CT scan    Orders Placed This Encounter  Procedures   NM PET Image Restag (PS) Skull Base To Thigh    Standing Status:   Future    Standing Expiration Date:   03/02/2022    Order Specific Question:   If indicated for the ordered procedure, I authorize the administration of a radiopharmaceutical per Radiology protocol    Answer:   Yes    Order Specific Question:   Preferred imaging location?    Answer:   [facility]    Order Specific Question:   Release to patient    Answer:   Immediate   The patient has a good understanding of the overall plan. she agrees with it. she will call with any problems that may develop before the next visit here.  Total time spent: 30 mins including face to face time and time spent for planning, charting and  coordination of care  Rulon Eisenmenger, MD, MPH 03/02/2021  I, Thana Ates, am acting as scribe for Dr. Nicholas Lose.  I have reviewed the above documentation for accuracy and completeness, and I agree with the above.

## 2021-03-02 ENCOUNTER — Inpatient Hospital Stay: Payer: Medicare PPO | Attending: Hematology and Oncology

## 2021-03-02 ENCOUNTER — Inpatient Hospital Stay: Payer: Medicare PPO

## 2021-03-02 ENCOUNTER — Other Ambulatory Visit: Payer: Self-pay

## 2021-03-02 ENCOUNTER — Inpatient Hospital Stay: Payer: Medicare PPO | Admitting: Hematology and Oncology

## 2021-03-02 DIAGNOSIS — D696 Thrombocytopenia, unspecified: Secondary | ICD-10-CM | POA: Insufficient documentation

## 2021-03-02 DIAGNOSIS — C50919 Malignant neoplasm of unspecified site of unspecified female breast: Secondary | ICD-10-CM | POA: Diagnosis not present

## 2021-03-02 DIAGNOSIS — C50911 Malignant neoplasm of unspecified site of right female breast: Secondary | ICD-10-CM | POA: Insufficient documentation

## 2021-03-02 DIAGNOSIS — Z79811 Long term (current) use of aromatase inhibitors: Secondary | ICD-10-CM | POA: Insufficient documentation

## 2021-03-02 DIAGNOSIS — Z170 Estrogen receptor positive status [ER+]: Secondary | ICD-10-CM | POA: Insufficient documentation

## 2021-03-02 DIAGNOSIS — R7989 Other specified abnormal findings of blood chemistry: Secondary | ICD-10-CM | POA: Diagnosis not present

## 2021-03-02 DIAGNOSIS — E039 Hypothyroidism, unspecified: Secondary | ICD-10-CM | POA: Insufficient documentation

## 2021-03-02 DIAGNOSIS — D72819 Decreased white blood cell count, unspecified: Secondary | ICD-10-CM | POA: Insufficient documentation

## 2021-03-02 DIAGNOSIS — C7951 Secondary malignant neoplasm of bone: Secondary | ICD-10-CM | POA: Insufficient documentation

## 2021-03-02 LAB — CBC WITH DIFFERENTIAL (CANCER CENTER ONLY)
Abs Immature Granulocytes: 0.02 10*3/uL (ref 0.00–0.07)
Basophils Absolute: 0 10*3/uL (ref 0.0–0.1)
Basophils Relative: 1 %
Eosinophils Absolute: 0.1 10*3/uL (ref 0.0–0.5)
Eosinophils Relative: 3 %
HCT: 39.8 % (ref 36.0–46.0)
Hemoglobin: 13.4 g/dL (ref 12.0–15.0)
Immature Granulocytes: 0 %
Lymphocytes Relative: 23 %
Lymphs Abs: 1.1 10*3/uL (ref 0.7–4.0)
MCH: 34.8 pg — ABNORMAL HIGH (ref 26.0–34.0)
MCHC: 33.7 g/dL (ref 30.0–36.0)
MCV: 103.4 fL — ABNORMAL HIGH (ref 80.0–100.0)
Monocytes Absolute: 0.5 10*3/uL (ref 0.1–1.0)
Monocytes Relative: 10 %
Neutro Abs: 3 10*3/uL (ref 1.7–7.7)
Neutrophils Relative %: 63 %
Platelet Count: 136 10*3/uL — ABNORMAL LOW (ref 150–400)
RBC: 3.85 MIL/uL — ABNORMAL LOW (ref 3.87–5.11)
RDW: 12.8 % (ref 11.5–15.5)
WBC Count: 4.8 10*3/uL (ref 4.0–10.5)
nRBC: 0 % (ref 0.0–0.2)

## 2021-03-02 LAB — CMP (CANCER CENTER ONLY)
ALT: 26 U/L (ref 0–44)
AST: 23 U/L (ref 15–41)
Albumin: 4.1 g/dL (ref 3.5–5.0)
Alkaline Phosphatase: 68 U/L (ref 38–126)
Anion gap: 7 (ref 5–15)
BUN: 16 mg/dL (ref 8–23)
CO2: 26 mmol/L (ref 22–32)
Calcium: 9.3 mg/dL (ref 8.9–10.3)
Chloride: 106 mmol/L (ref 98–111)
Creatinine: 1.12 mg/dL — ABNORMAL HIGH (ref 0.44–1.00)
GFR, Estimated: 53 mL/min — ABNORMAL LOW (ref >60)
Glucose, Bld: 94 mg/dL (ref 70–99)
Potassium: 4.3 mmol/L (ref 3.5–5.1)
Sodium: 139 mmol/L (ref 135–145)
Total Bilirubin: 0.7 mg/dL (ref 0.3–1.2)
Total Protein: 6.8 g/dL (ref 6.5–8.1)

## 2021-03-02 MED ORDER — ZOLEDRONIC ACID 4 MG/100ML IV SOLN
4.0000 mg | Freq: Once | INTRAVENOUS | Status: AC
  Administered 2021-03-02: 10:00:00 4 mg via INTRAVENOUS
  Filled 2021-03-02: qty 100

## 2021-03-02 MED ORDER — SODIUM CHLORIDE 0.9 % IV SOLN: Freq: Once | INTRAVENOUS | Status: AC

## 2021-03-03 LAB — THYROID PANEL WITH TSH
Free Thyroxine Index: 1.4 (ref 1.2–4.9)
T3 Uptake Ratio: 21 % — ABNORMAL LOW (ref 24–39)
T4, Total: 6.6 ug/dL (ref 4.5–12.0)
TSH: 0.721 u[IU]/mL (ref 0.450–4.500)

## 2021-03-10 NOTE — Telephone Encounter (Signed)
Patient is approved for Verzenio at no cost from [employer] 03/09/21-02/05/22  [place] uses [place] [facility] [place] Phone [phone] Fax [phone] 03/10/2021 8:49 AM

## 2021-03-14 DIAGNOSIS — H2513 Age-related nuclear cataract, bilateral: Secondary | ICD-10-CM | POA: Diagnosis not present

## 2021-03-14 DIAGNOSIS — H31001 Unspecified chorioretinal scars, right eye: Secondary | ICD-10-CM | POA: Diagnosis not present

## 2021-03-14 DIAGNOSIS — Z135 Encounter for screening for eye and ear disorders: Secondary | ICD-10-CM | POA: Diagnosis not present

## 2021-03-14 DIAGNOSIS — H5200 Hypermetropia, unspecified eye: Secondary | ICD-10-CM | POA: Diagnosis not present

## 2021-03-24 ENCOUNTER — Other Ambulatory Visit: Payer: Self-pay | Admitting: *Deleted

## 2021-03-24 DIAGNOSIS — C50919 Malignant neoplasm of unspecified site of unspecified female breast: Secondary | ICD-10-CM

## 2021-03-24 MED ORDER — ABEMACICLIB 100 MG PO TABS: 100.0000 mg | ORAL_TABLET | Freq: Two times a day (BID) | ORAL | 11 refills | Status: DC

## 2021-05-08 ENCOUNTER — Encounter: Payer: Self-pay | Admitting: Hematology and Oncology

## 2021-05-19 NOTE — Progress Notes (Signed)
? ?Patient Care Team: ?Patient, [name] (Inactive) as PCP - General (General Practice) ? ?DIAGNOSIS:  ?Encounter Diagnosis  ?Name Primary?  ? Primary malignant neoplasm of breast with metastasis ([place])   ? ? ?SUMMARY OF ONCOLOGIC HISTORY: ?Oncology History  ?Primary malignant neoplasm of breast with metastasis ([place])  ?09/25/2018 Initial Diagnosis  ? Right breast biopsy on 09/25/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  ?  ?05/22/2019 PET scan  ? PET scan on 05/22/19 showed resolution of the multiple osseous metastatic lesions, right chest wall mass, and bilateral axillary lymphadenopathy.  ?  ? Anti-estrogen oral therapy  ? Abemaciclib, Anastrozole and Zometa.  (Moved from [place]) ?  ?09/10/2019 Genetic Testing  ? Negative genetic testing:  No pathogenic variants detected on the [facility] Common Hereditary Cancers panel. The report date is 09/10/2019.  ? ?The Common Hereditary Cancers Panel offered by [facility] includes sequencing and/or deletion duplication testing of the following 48 genes: APC, ATM, AXIN2, BARD1, BMPR1A, BRCA1, BRCA2, BRIP1, CDH1, CDK4, CDKN2A (p14ARF), CDKN2A (p16INK4a), CHEK2, CTNNA1, DICER1, EPCAM (Deletion/duplication testing only), GREM1 (promoter region deletion/duplication testing only), KIT, MEN1, MLH1, MSH2, MSH3, MSH6, MUTYH, NBN, NF1, NTHL1, PALB2, PDGFRA, PMS2, POLD1, POLE, PTEN, RAD50, RAD51C, RAD51D, RNF43, SDHB, SDHC, SDHD, SMAD4, SMARCA4. STK11, TP53, TSC1, TSC2, and VHL.  The following genes were evaluated for sequence changes only: SDHA and HOXB13 c.251G>A variant only.  ?  ? ? ?CHIEF COMPLIANT: Follow-up of metastatic breast cancer and Pet scan ? ?INTERVAL HISTORY: Norma Murray is a   71 y.o. with above-mentioned history of metastatic breast cancer currently on treatment with Abemaciclib, anastrozole, and Zometa. She presents to the clinic today for follow-up. She denies diarrhea and states that her energy level is good. She states that she is  tolerating the anastrozole. ? ? ?ALLERGIES:  has No Known Allergies. ? ?MEDICATIONS:  ?Current Outpatient Medications  ?Medication Sig Dispense Refill  ? abemaciclib (VERZENIO) 100 MG tablet Take 1 tablet (100 mg total) by mouth 2 (two) times daily. Swallow tablets whole. Do not chew, crush, or split tablets before swallowing. 56 tablet 11  ? amoxicillin-clavulanate (AUGMENTIN) 875-125 MG tablet Take 1 tablet by mouth 2 (two) times daily. 14 tablet 0  ? anastrozole (ARIMIDEX) 1 MG tablet TAKE 1 TABLET BY MOUTH EVERY DAY 90 tablet 3  ? cyclobenzaprine (FLEXERIL) 10 MG tablet Take 1 tablet (10 mg total) by mouth 3 (three) times daily as needed for muscle spasms. 20 tablet 0  ? levothyroxine (SYNTHROID) 50 MCG tablet TAKE 1 TABLET BY MOUTH DAILY BEFORE BREAKFAST 90 tablet 0  ? ?No current facility-administered medications for this visit.  ? ? ?PHYSICAL EXAMINATION: ?ECOG PERFORMANCE STATUS: 1 - Symptomatic but completely ambulatory ? ?Vitals:  ? 06/02/21 0821  ?BP: 136/84  ?Pulse: 75  ?Resp: 18  ?Temp: 97.9 ?F (36.6 ?C)  ?SpO2: 100%  ? ?Filed Weights  ? 06/02/21 0821  ?Weight: 147 lb 11.2 oz (67 kg)  ? ?  ? ?LABORATORY DATA:  ?I have reviewed the data as listed ? ?  Latest Ref Rng & Units 03/02/2021  ?  7:51 AM 11/30/2020  ?  7:50 AM 08/30/2020  ?  9:14 AM  ?CMP  ?Glucose 70 - 99 mg/dL 94   85   90    ?BUN 8 - 23 mg/dL 16   18   18     ?Creatinine 0.44 - 1.00 mg/dL 1.12   1.03   1.03    ?Sodium 135 - 145 mmol/L  139   140   141    ?Potassium 3.5 - 5.1 mmol/L 4.3   4.1   3.9    ?Chloride 98 - 111 mmol/L 106   111   110    ?CO2 22 - 32 mmol/L 26   19   22     ?Calcium 8.9 - 10.3 mg/dL 9.3   9.6   9.2    ?Total Protein 6.5 - 8.1 g/dL 6.8   6.9   6.5    ?Total Bilirubin 0.3 - 1.2 mg/dL 0.7   0.7   0.8    ?Alkaline Phos 38 - 126 U/L 68   72   55    ?AST 15 - 41 U/L 23   25   25     ?ALT 0 - 44 U/L 26   30   28     ? ? ?Lab Results  ?Component Value Date  ? WBC 3.1 (L) 06/02/2021  ? HGB 12.7 06/02/2021  ? HCT 36.9 06/02/2021  ? MCV  105.7 (H) 06/02/2021  ? PLT 141 (L) 06/02/2021  ? NEUTROABS 1.6 (L) 06/02/2021  ? ? ?ASSESSMENT & PLAN:  ?Primary malignant neoplasm of breast with metastasis ([place]) ?Right breast biopsy on 09/25/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  ?Patient moved from [place] because she could not tolerate the heat. ?  ?Current treatment: Abemaciclib, anastrozole and Zometa  ?  ?Toxicities: ?1.  Elevated LFTs: We reduced the dosage to 100 mg p.o. twice daily.  She is tolerating it extremely well with normal LFTs. ?2. leukopenia/neutropenia: ANC 2 ?3.  Thrombocytopenia: Platelets 88: I suspect this is related to COVID-19 and its treatment.  We will continue with the same dosing. ?COVID-19 infection January 2022: Treated with antibiotic treatments. ?  ?Bone metastases: On Zometa every 3 months ?PET CT scan 11/08/20: No evidence of recurrent breast cancer.  No evidence of metastatic disease in the chest abdomen pelvis.  Evidence of diffuse treated bone metastatic disease without hypermetabolism. ?  ?Hypothyroidism: on thyroid medication. ?  ?Continue current treatment ?05/25/2021: PET CT scan: Diffuse skeletal bone metastases without metabolic activity consistent with treated breast cancer. ?RTC in 3 months with labs and Zometa and scans in 6 months ? ? ? ?Orders Placed This Encounter  ?Procedures  ? CBC with Differential ([place] Only)  ?  Standing Status:   Future  ?  Standing Expiration Date:   06/03/2022  ? CMP ([place] only)  ?  Standing Status:   Future  ?  Standing Expiration Date:   06/03/2022  ? Thyroid Panel With TSH  ?  Standing Status:   Future  ?  Standing Expiration Date:   06/02/2022  ? ?The patient has a good understanding of the overall plan. she agrees with it. she will call with any problems that may develop before the next visit here. ?Total time spent: 30 mins including face to face time and time spent for planning, charting and co-ordination of care ? ? [name], MD ?06/02/21 ? ?I Gardiner Coins am scribing for Dr. Lindi Adie ? ?I have reviewed the above documentation for accuracy and completeness, and I agree with the above. ?  ?  ?

## 2021-05-25 ENCOUNTER — Ambulatory Visit (HOSPITAL_COMMUNITY)
Admission: RE | Admit: 2021-05-25 | Discharge: 2021-05-25 | Disposition: A | Discharge status: Home / Self Care | Payer: Medicare PPO | Source: Ambulatory Visit | Attending: Hematology and Oncology | Admitting: Hematology and Oncology

## 2021-05-25 DIAGNOSIS — C7951 Secondary malignant neoplasm of bone: Secondary | ICD-10-CM | POA: Insufficient documentation

## 2021-05-25 DIAGNOSIS — C50919 Malignant neoplasm of unspecified site of unspecified female breast: Secondary | ICD-10-CM | POA: Diagnosis not present

## 2021-05-25 LAB — GLUCOSE, CAPILLARY: Glucose-Capillary: 102 mg/dL — ABNORMAL HIGH (ref 70–99)

## 2021-05-25 MED ORDER — FLUDEOXYGLUCOSE F - 18 (FDG) INJECTION
7.2000 | Freq: Once | INTRAVENOUS | Status: AC | PRN
  Administered 2021-05-25: 7.99 via INTRAVENOUS

## 2021-06-01 ENCOUNTER — Other Ambulatory Visit: Payer: Self-pay

## 2021-06-01 DIAGNOSIS — Z780 Asymptomatic menopausal state: Secondary | ICD-10-CM

## 2021-06-01 DIAGNOSIS — C50919 Malignant neoplasm of unspecified site of unspecified female breast: Secondary | ICD-10-CM

## 2021-06-01 NOTE — Assessment & Plan Note (Signed)
Right breast biopsy on 09/25/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? ?Patient moved from [place] because she could not tolerate the heat. ?? ?Current treatment:?Abemaciclib, anastrozole and Zometa? ?? ?Toxicities: ?1.??Elevated LFTs:?We reduced?the dosage to?100 mg p.o. twice daily. ?She is tolerating it extremely well with normal LFTs. ?2.?leukopenia/neutropenia: ANC?2 ?3.??Thrombocytopenia: Platelets?88: I suspect this is related to COVID-19 and its treatment. ?We will continue with the same dosing. ?COVID-19 infection January 2022: Treated with antibiotic treatments. ?? ?Bone metastases: On Zometa?every 3 months ?PET CT scan?11/08/20: No evidence of recurrent breast cancer. ?No evidence of metastatic disease in the chest abdomen pelvis. ?Evidence of diffuse treated bone metastatic disease without hypermetabolism. ?? ?Hypothyroidism: on thyroid medication. ?We will try to add TSH to today's labs. ?? ?Continue current treatment ?05/25/2021: PET CT scan: Diffuse skeletal bone metastases without metabolic activity consistent with treated breast cancer. ?RTC in 3 months with labs?and Zometa  ?

## 2021-06-02 ENCOUNTER — Other Ambulatory Visit: Payer: Self-pay

## 2021-06-02 ENCOUNTER — Inpatient Hospital Stay: Payer: Medicare PPO | Admitting: Hematology and Oncology

## 2021-06-02 ENCOUNTER — Inpatient Hospital Stay: Payer: Medicare PPO | Attending: Hematology and Oncology

## 2021-06-02 ENCOUNTER — Inpatient Hospital Stay: Payer: Medicare PPO

## 2021-06-02 DIAGNOSIS — C7951 Secondary malignant neoplasm of bone: Secondary | ICD-10-CM | POA: Diagnosis not present

## 2021-06-02 DIAGNOSIS — R7989 Other specified abnormal findings of blood chemistry: Secondary | ICD-10-CM | POA: Diagnosis not present

## 2021-06-02 DIAGNOSIS — Z780 Asymptomatic menopausal state: Secondary | ICD-10-CM

## 2021-06-02 DIAGNOSIS — C50919 Malignant neoplasm of unspecified site of unspecified female breast: Secondary | ICD-10-CM

## 2021-06-02 DIAGNOSIS — D696 Thrombocytopenia, unspecified: Secondary | ICD-10-CM | POA: Diagnosis not present

## 2021-06-02 DIAGNOSIS — C50911 Malignant neoplasm of unspecified site of right female breast: Secondary | ICD-10-CM | POA: Diagnosis not present

## 2021-06-02 DIAGNOSIS — Z79899 Other long term (current) drug therapy: Secondary | ICD-10-CM | POA: Diagnosis not present

## 2021-06-02 DIAGNOSIS — E039 Hypothyroidism, unspecified: Secondary | ICD-10-CM | POA: Insufficient documentation

## 2021-06-02 DIAGNOSIS — D72819 Decreased white blood cell count, unspecified: Secondary | ICD-10-CM | POA: Diagnosis not present

## 2021-06-02 DIAGNOSIS — Z170 Estrogen receptor positive status [ER+]: Secondary | ICD-10-CM | POA: Insufficient documentation

## 2021-06-02 LAB — CMP (CANCER CENTER ONLY)
ALT: 32 U/L (ref 0–44)
AST: 32 U/L (ref 15–41)
Albumin: 4 g/dL (ref 3.5–5.0)
Alkaline Phosphatase: 63 U/L (ref 38–126)
Anion gap: 6 (ref 5–15)
BUN: 18 mg/dL (ref 8–23)
CO2: 26 mmol/L (ref 22–32)
Calcium: 9.1 mg/dL (ref 8.9–10.3)
Chloride: 109 mmol/L (ref 98–111)
Creatinine: 1.03 mg/dL — ABNORMAL HIGH (ref 0.44–1.00)
GFR, Estimated: 59 mL/min — ABNORMAL LOW (ref >60)
Glucose, Bld: 110 mg/dL — ABNORMAL HIGH (ref 70–99)
Potassium: 4.4 mmol/L (ref 3.5–5.1)
Sodium: 141 mmol/L (ref 135–145)
Total Bilirubin: 0.5 mg/dL (ref 0.3–1.2)
Total Protein: 6.4 g/dL — ABNORMAL LOW (ref 6.5–8.1)

## 2021-06-02 LAB — CBC WITH DIFFERENTIAL (CANCER CENTER ONLY)
Abs Immature Granulocytes: 0 10*3/uL (ref 0.00–0.07)
Basophils Absolute: 0.1 10*3/uL (ref 0.0–0.1)
Basophils Relative: 2 %
Eosinophils Absolute: 0.1 10*3/uL (ref 0.0–0.5)
Eosinophils Relative: 4 %
HCT: 36.9 % (ref 36.0–46.0)
Hemoglobin: 12.7 g/dL (ref 12.0–15.0)
Immature Granulocytes: 0 %
Lymphocytes Relative: 33 %
Lymphs Abs: 1 10*3/uL (ref 0.7–4.0)
MCH: 36.4 pg — ABNORMAL HIGH (ref 26.0–34.0)
MCHC: 34.4 g/dL (ref 30.0–36.0)
MCV: 105.7 fL — ABNORMAL HIGH (ref 80.0–100.0)
Monocytes Absolute: 0.4 10*3/uL (ref 0.1–1.0)
Monocytes Relative: 12 %
Neutro Abs: 1.6 10*3/uL — ABNORMAL LOW (ref 1.7–7.7)
Neutrophils Relative %: 49 %
Platelet Count: 141 10*3/uL — ABNORMAL LOW (ref 150–400)
RBC: 3.49 MIL/uL — ABNORMAL LOW (ref 3.87–5.11)
RDW: 13.3 % (ref 11.5–15.5)
WBC Count: 3.1 10*3/uL — ABNORMAL LOW (ref 4.0–10.5)
nRBC: 0 % (ref 0.0–0.2)

## 2021-06-02 MED ORDER — ZOLEDRONIC ACID 4 MG/100ML IV SOLN
4.0000 mg | Freq: Once | INTRAVENOUS | Status: AC
  Administered 2021-06-02: 4 mg via INTRAVENOUS
  Filled 2021-06-02: qty 100

## 2021-06-02 MED ORDER — SODIUM CHLORIDE 0.9 % IV SOLN: INTRAVENOUS | Status: DC

## 2021-06-03 LAB — THYROID PANEL WITH TSH
Free Thyroxine Index: 1.4 (ref 1.2–4.9)
T3 Uptake Ratio: 23 % — ABNORMAL LOW (ref 24–39)
T4, Total: 6.1 ug/dL (ref 4.5–12.0)
TSH: 2.43 u[IU]/mL (ref 0.450–4.500)

## 2021-08-31 NOTE — Progress Notes (Signed)
Patient Care Team: Patient, [name] as PCP - General (General Practice)  DIAGNOSIS: No diagnosis found.  SUMMARY OF ONCOLOGIC HISTORY: Oncology History  Primary malignant neoplasm of breast with metastasis ([place])  09/25/2018 Initial Diagnosis   Right breast biopsy on 09/25/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    05/22/2019 PET scan   PET scan on 05/22/19 showed resolution of the multiple osseous metastatic lesions, right chest wall mass, and bilateral axillary lymphadenopathy.     Anti-estrogen oral therapy   Abemaciclib, Anastrozole and Zometa.  (Moved from [place])   09/10/2019 Genetic Testing   Negative genetic testing:  No pathogenic variants detected on the [facility] Common Hereditary Cancers panel. The report date is 09/10/2019.   The Common Hereditary Cancers Panel offered by [facility] includes sequencing and/or deletion duplication testing of the following 48 genes: APC, ATM, AXIN2, BARD1, BMPR1A, BRCA1, BRCA2, BRIP1, CDH1, CDK4, CDKN2A (p14ARF), CDKN2A (p16INK4a), CHEK2, CTNNA1, DICER1, EPCAM (Deletion/duplication testing only), GREM1 (promoter region deletion/duplication testing only), KIT, MEN1, MLH1, MSH2, MSH3, MSH6, MUTYH, NBN, NF1, NTHL1, PALB2, PDGFRA, PMS2, POLD1, POLE, PTEN, RAD50, RAD51C, RAD51D, RNF43, SDHB, SDHC, SDHD, SMAD4, SMARCA4. STK11, TP53, TSC1, TSC2, and VHL.  The following genes were evaluated for sequence changes only: SDHA and HOXB13 c.251G>A variant only.      CHIEF COMPLIANT: Follow-up of metastatic breast cancer and Pet scan  INTERVAL HISTORY: Norma Murray is a 71 y.o. with above-mentioned history of metastatic breast cancer currently on treatment with Abemaciclib, anastrozole, and Zometa. She presents to the clinic today for follow-up.    ALLERGIES:  has No Known Allergies.  MEDICATIONS:  Current Outpatient Medications  Medication Sig Dispense Refill   abemaciclib (VERZENIO) 100 MG tablet Take 1 tablet (100 mg  total) by mouth 2 (two) times daily. Swallow tablets whole. Do not chew, crush, or split tablets before swallowing. 56 tablet 11   amoxicillin-clavulanate (AUGMENTIN) 875-125 MG tablet Take 1 tablet by mouth 2 (two) times daily. 14 tablet 0   anastrozole (ARIMIDEX) 1 MG tablet TAKE 1 TABLET BY MOUTH EVERY DAY 90 tablet 3   cyclobenzaprine (FLEXERIL) 10 MG tablet Take 1 tablet (10 mg total) by mouth 3 (three) times daily as needed for muscle spasms. 20 tablet 0   levothyroxine (SYNTHROID) 50 MCG tablet TAKE 1 TABLET BY MOUTH DAILY BEFORE BREAKFAST 90 tablet 0   No current facility-administered medications for this visit.    PHYSICAL EXAMINATION: ECOG PERFORMANCE STATUS: {CHL ONC ECOG PS:[phone]}  There were no vitals filed for this visit. There were no vitals filed for this visit.  BREAST:*** No palpable masses or nodules in either right or left breasts. No palpable axillary supraclavicular or infraclavicular adenopathy no breast tenderness or nipple discharge. (exam performed in the presence of a chaperone)  LABORATORY DATA:  I have reviewed the data as listed    Latest Ref Rng & Units 06/02/2021    8:03 AM 03/02/2021    7:51 AM 11/30/2020    7:50 AM  CMP  Glucose 70 - 99 mg/dL 110  94  85   BUN 8 - 23 mg/dL _0 Creatinine 0.44 - 1.00 mg/dL 1.03  1.12  1.03   Sodium 135 - 145 mmol/L 141  139  140   Potassium 3.5 - 5.1 mmol/L 4.4  4.3  4.1   Chloride 98 - 111 mmol/L 109  106  111   CO2 22 - 32 mmol/L 26  26  19  Calcium 8.9 - 10.3 mg/dL 9.1  9.3  9.6   Total Protein 6.5 - 8.1 g/dL 6.4  6.8  6.9   Total Bilirubin 0.3 - 1.2 mg/dL 0.5  0.7  0.7   Alkaline Phos 38 - 126 U/L 63  68  72   AST 15 - 41 U/L 32  23  25   ALT 0 - 44 U/L 32  26  30     Lab Results  Component Value Date   WBC 3.1 (L) 06/02/2021   HGB 12.7 06/02/2021   HCT 36.9 06/02/2021   MCV 105.7 (H) 06/02/2021   PLT 141 (L) 06/02/2021   NEUTROABS 1.6 (L) 06/02/2021    ASSESSMENT & PLAN:  No  problem-specific Assessment & Plan notes found for this encounter.    No orders of the defined types were placed in this encounter.  The patient has a good understanding of the overall plan. she agrees with it. she will call with any problems that may develop before the next visit here. Total time spent: 30 mins including face to face time and time spent for planning, charting and co-ordination of care   Suzzette Righter, [place] 08/31/21    I Gardiner Coins am scribing for Dr. Lindi Adie  ***

## 2021-09-01 ENCOUNTER — Inpatient Hospital Stay: Payer: Medicare PPO | Admitting: Hematology and Oncology

## 2021-09-01 ENCOUNTER — Inpatient Hospital Stay: Payer: Medicare PPO | Attending: Hematology and Oncology

## 2021-09-01 ENCOUNTER — Other Ambulatory Visit: Payer: Self-pay

## 2021-09-01 ENCOUNTER — Inpatient Hospital Stay: Payer: Medicare PPO

## 2021-09-01 VITALS — BP 130/81 | HR 76 | Temp 97.3°F | Resp 18 | Ht 65.5 in | Wt 143.8 lb

## 2021-09-01 DIAGNOSIS — C50911 Malignant neoplasm of unspecified site of right female breast: Secondary | ICD-10-CM | POA: Insufficient documentation

## 2021-09-01 DIAGNOSIS — C50919 Malignant neoplasm of unspecified site of unspecified female breast: Secondary | ICD-10-CM

## 2021-09-01 DIAGNOSIS — E039 Hypothyroidism, unspecified: Secondary | ICD-10-CM | POA: Insufficient documentation

## 2021-09-01 DIAGNOSIS — Z170 Estrogen receptor positive status [ER+]: Secondary | ICD-10-CM | POA: Diagnosis not present

## 2021-09-01 DIAGNOSIS — Z79899 Other long term (current) drug therapy: Secondary | ICD-10-CM | POA: Insufficient documentation

## 2021-09-01 DIAGNOSIS — Z79811 Long term (current) use of aromatase inhibitors: Secondary | ICD-10-CM | POA: Diagnosis not present

## 2021-09-01 DIAGNOSIS — C7951 Secondary malignant neoplasm of bone: Secondary | ICD-10-CM | POA: Insufficient documentation

## 2021-09-01 LAB — CBC WITH DIFFERENTIAL (CANCER CENTER ONLY)
Abs Immature Granulocytes: 0.01 10*3/uL (ref 0.00–0.07)
Basophils Absolute: 0.1 10*3/uL (ref 0.0–0.1)
Basophils Relative: 2 %
Eosinophils Absolute: 0.1 10*3/uL (ref 0.0–0.5)
Eosinophils Relative: 3 %
HCT: 38.9 % (ref 36.0–46.0)
Hemoglobin: 13.4 g/dL (ref 12.0–15.0)
Immature Granulocytes: 0 %
Lymphocytes Relative: 28 %
Lymphs Abs: 1.1 10*3/uL (ref 0.7–4.0)
MCH: 36.4 pg — ABNORMAL HIGH (ref 26.0–34.0)
MCHC: 34.4 g/dL (ref 30.0–36.0)
MCV: 105.7 fL — ABNORMAL HIGH (ref 80.0–100.0)
Monocytes Absolute: 0.4 10*3/uL (ref 0.1–1.0)
Monocytes Relative: 12 %
Neutro Abs: 2.1 10*3/uL (ref 1.7–7.7)
Neutrophils Relative %: 55 %
Platelet Count: 138 10*3/uL — ABNORMAL LOW (ref 150–400)
RBC: 3.68 MIL/uL — ABNORMAL LOW (ref 3.87–5.11)
RDW: 14 % (ref 11.5–15.5)
WBC Count: 3.8 10*3/uL — ABNORMAL LOW (ref 4.0–10.5)
nRBC: 0 % (ref 0.0–0.2)

## 2021-09-01 LAB — CMP (CANCER CENTER ONLY)
ALT: 31 U/L (ref 0–44)
AST: 26 U/L (ref 15–41)
Albumin: 4.1 g/dL (ref 3.5–5.0)
Alkaline Phosphatase: 59 U/L (ref 38–126)
Anion gap: 6 (ref 5–15)
BUN: 19 mg/dL (ref 8–23)
CO2: 25 mmol/L (ref 22–32)
Calcium: 9 mg/dL (ref 8.9–10.3)
Chloride: 109 mmol/L (ref 98–111)
Creatinine: 1.03 mg/dL — ABNORMAL HIGH (ref 0.44–1.00)
GFR, Estimated: 58 mL/min — ABNORMAL LOW (ref >60)
Glucose, Bld: 94 mg/dL (ref 70–99)
Potassium: 4.3 mmol/L (ref 3.5–5.1)
Sodium: 140 mmol/L (ref 135–145)
Total Bilirubin: 0.7 mg/dL (ref 0.3–1.2)
Total Protein: 6.8 g/dL (ref 6.5–8.1)

## 2021-09-01 MED ORDER — HEPARIN SOD (PORK) LOCK FLUSH 100 UNIT/ML IV SOLN: 250.0000 [IU] | Freq: Once | INTRAVENOUS | Status: DC | PRN

## 2021-09-01 MED ORDER — SODIUM CHLORIDE 0.9% FLUSH: 10.0000 mL | Freq: Once | INTRAVENOUS | Status: DC | PRN

## 2021-09-01 MED ORDER — ALTEPLASE 2 MG IJ SOLR: 2.0000 mg | Freq: Once | INTRAMUSCULAR | Status: DC | PRN

## 2021-09-01 MED ORDER — SODIUM CHLORIDE 0.9% FLUSH: 3.0000 mL | Freq: Once | INTRAVENOUS | Status: DC | PRN

## 2021-09-01 MED ORDER — HEPARIN SOD (PORK) LOCK FLUSH 100 UNIT/ML IV SOLN: 500.0000 [IU] | Freq: Once | INTRAVENOUS | Status: DC | PRN

## 2021-09-01 MED ORDER — ZOLEDRONIC ACID 4 MG/5ML IV CONC
3.0000 mg | Freq: Once | INTRAVENOUS | Status: AC
  Administered 2021-09-01: 3 mg via INTRAVENOUS
  Filled 2021-09-01: qty 3.75

## 2021-09-01 MED ORDER — SODIUM CHLORIDE 0.9 % IV SOLN: INTRAVENOUS | Status: DC

## 2021-09-01 NOTE — Assessment & Plan Note (Signed)
Right breast biopsy on 09/25/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 Patient moved from [place] because she could not tolerate the heat.  Current treatment:Abemaciclib, anastrozole and Zometa  Toxicities: 1.Elevated LFTs:We reducedthe dosage to100 mg p.o. twice daily. She is tolerating it extremely well with normal LFTs. 2.leukopenia/neutropenia: ANC2 3.Thrombocytopenia: Platelets88: I suspect this is related to COVID-19 and its treatment. We will continue with the same dosing. COVID-19 infection January 2022: Treated with antibiotic treatments.  Bone metastases: On Zometaevery 3 months PET CT scan10/3/22: No evidence of recurrent breast cancer. No evidence of metastatic disease in the chest abdomen pelvis. Evidence of diffuse treated bone metastatic disease without hypermetabolism.  Hypothyroidism: onthyroid medication.  Continue current treatment 05/25/2021: PET CT scan: Diffuse skeletal bone metastases without metabolic activity consistent with treated breast cancer. RTC in 3 months with labsand Zometaand scans

## 2021-09-01 NOTE — Patient Instructions (Signed)

## 2021-09-02 LAB — THYROID PANEL WITH TSH
Free Thyroxine Index: 1.7 (ref 1.2–4.9)
T3 Uptake Ratio: 23 % — ABNORMAL LOW (ref 24–39)
T4, Total: 7.2 ug/dL (ref 4.5–12.0)
TSH: 1.35 u[IU]/mL (ref 0.450–4.500)

## 2021-09-20 DIAGNOSIS — N3000 Acute cystitis without hematuria: Secondary | ICD-10-CM | POA: Diagnosis not present

## 2021-09-21 DIAGNOSIS — N3000 Acute cystitis without hematuria: Secondary | ICD-10-CM | POA: Diagnosis not present

## 2021-10-01 ENCOUNTER — Other Ambulatory Visit: Payer: Self-pay | Admitting: Hematology and Oncology

## 2021-11-28 ENCOUNTER — Encounter (HOSPITAL_COMMUNITY)
Admission: RE | Admit: 2021-11-28 | Discharge: 2021-11-28 | Disposition: A | Discharge status: Home / Self Care | Payer: Medicare PPO | Source: Ambulatory Visit | Attending: Hematology and Oncology | Admitting: Hematology and Oncology

## 2021-11-28 DIAGNOSIS — M899 Disorder of bone, unspecified: Secondary | ICD-10-CM | POA: Diagnosis not present

## 2021-11-28 DIAGNOSIS — C50919 Malignant neoplasm of unspecified site of unspecified female breast: Secondary | ICD-10-CM | POA: Diagnosis not present

## 2021-11-28 LAB — GLUCOSE, CAPILLARY: Glucose-Capillary: 89 mg/dL (ref 70–99)

## 2021-11-28 MED ORDER — FLUDEOXYGLUCOSE F - 18 (FDG) INJECTION
7.0000 | Freq: Once | INTRAVENOUS | Status: AC | PRN
  Administered 2021-11-28: 7.12 via INTRAVENOUS

## 2021-11-30 ENCOUNTER — Other Ambulatory Visit: Payer: Self-pay | Admitting: Hematology and Oncology

## 2021-11-30 NOTE — Telephone Encounter (Signed)
Pt needing to obtain from PCP

## 2021-12-01 ENCOUNTER — Other Ambulatory Visit: Payer: Self-pay | Admitting: *Deleted

## 2021-12-01 DIAGNOSIS — C50919 Malignant neoplasm of unspecified site of unspecified female breast: Secondary | ICD-10-CM

## 2021-12-02 ENCOUNTER — Telehealth: Payer: Self-pay | Admitting: Pharmacy Technician

## 2021-12-02 NOTE — Telephone Encounter (Signed)
Oral Oncology Patient Advocate Encounter   Received notification that patient is due for re-enrollment for assistance for Verzenio through [employer].   Re-enrollment process has been initiated and will be submitted upon completion of necessary documents.  Patient agreed to sign documents on 12/05/2021.  [employer] number [phone].   I will continue to follow until final determination.  Lady Deutscher, CPhT-Adv Oncology Pharmacy Patient [place] Direct Number: [phone]  Fax: [phone]

## 2021-12-05 ENCOUNTER — Inpatient Hospital Stay: Payer: Medicare PPO | Attending: Hematology and Oncology

## 2021-12-05 ENCOUNTER — Other Ambulatory Visit: Payer: Self-pay

## 2021-12-05 ENCOUNTER — Inpatient Hospital Stay: Payer: Medicare PPO | Admitting: Hematology and Oncology

## 2021-12-05 ENCOUNTER — Inpatient Hospital Stay: Payer: Medicare PPO

## 2021-12-05 VITALS — BP 134/71 | HR 66 | Temp 98.2°F | Resp 16

## 2021-12-05 DIAGNOSIS — C50919 Malignant neoplasm of unspecified site of unspecified female breast: Secondary | ICD-10-CM

## 2021-12-05 DIAGNOSIS — Z79811 Long term (current) use of aromatase inhibitors: Secondary | ICD-10-CM | POA: Diagnosis not present

## 2021-12-05 DIAGNOSIS — C7951 Secondary malignant neoplasm of bone: Secondary | ICD-10-CM | POA: Insufficient documentation

## 2021-12-05 DIAGNOSIS — E039 Hypothyroidism, unspecified: Secondary | ICD-10-CM | POA: Diagnosis not present

## 2021-12-05 DIAGNOSIS — C50911 Malignant neoplasm of unspecified site of right female breast: Secondary | ICD-10-CM | POA: Diagnosis not present

## 2021-12-05 DIAGNOSIS — Z170 Estrogen receptor positive status [ER+]: Secondary | ICD-10-CM | POA: Insufficient documentation

## 2021-12-05 LAB — CMP (CANCER CENTER ONLY)
ALT: 24 U/L (ref 0–44)
AST: 24 U/L (ref 15–41)
Albumin: 4 g/dL (ref 3.5–5.0)
Alkaline Phosphatase: 56 U/L (ref 38–126)
Anion gap: 5 (ref 5–15)
BUN: 18 mg/dL (ref 8–23)
CO2: 25 mmol/L (ref 22–32)
Calcium: 9 mg/dL (ref 8.9–10.3)
Chloride: 108 mmol/L (ref 98–111)
Creatinine: 1.13 mg/dL — ABNORMAL HIGH (ref 0.44–1.00)
GFR, Estimated: 52 mL/min — ABNORMAL LOW (ref >60)
Glucose, Bld: 94 mg/dL (ref 70–99)
Potassium: 4.1 mmol/L (ref 3.5–5.1)
Sodium: 138 mmol/L (ref 135–145)
Total Bilirubin: 0.6 mg/dL (ref 0.3–1.2)
Total Protein: 6.6 g/dL (ref 6.5–8.1)

## 2021-12-05 LAB — CBC WITH DIFFERENTIAL (CANCER CENTER ONLY)
Abs Immature Granulocytes: 0.01 10*3/uL (ref 0.00–0.07)
Basophils Absolute: 0.1 10*3/uL (ref 0.0–0.1)
Basophils Relative: 2 %
Eosinophils Absolute: 0.1 10*3/uL (ref 0.0–0.5)
Eosinophils Relative: 4 %
HCT: 38.3 % (ref 36.0–46.0)
Hemoglobin: 13.3 g/dL (ref 12.0–15.0)
Immature Granulocytes: 0 %
Lymphocytes Relative: 24 %
Lymphs Abs: 1 10*3/uL (ref 0.7–4.0)
MCH: 36.8 pg — ABNORMAL HIGH (ref 26.0–34.0)
MCHC: 34.7 g/dL (ref 30.0–36.0)
MCV: 106.1 fL — ABNORMAL HIGH (ref 80.0–100.0)
Monocytes Absolute: 0.4 10*3/uL (ref 0.1–1.0)
Monocytes Relative: 11 %
Neutro Abs: 2.4 10*3/uL (ref 1.7–7.7)
Neutrophils Relative %: 59 %
Platelet Count: 143 10*3/uL — ABNORMAL LOW (ref 150–400)
RBC: 3.61 MIL/uL — ABNORMAL LOW (ref 3.87–5.11)
RDW: 12.3 % (ref 11.5–15.5)
WBC Count: 4 10*3/uL (ref 4.0–10.5)
nRBC: 0 % (ref 0.0–0.2)

## 2021-12-05 MED ORDER — ZOLEDRONIC ACID 4 MG/5ML IV CONC
3.0000 mg | Freq: Once | INTRAVENOUS | Status: AC
  Administered 2021-12-05: 3 mg via INTRAVENOUS
  Filled 2021-12-05: qty 3.75

## 2021-12-05 MED ORDER — SODIUM CHLORIDE 0.9 % IV SOLN: Freq: Once | INTRAVENOUS | Status: AC

## 2021-12-05 NOTE — Progress Notes (Signed)
Norma Murray Care Team: Norma Murray, [name] as PCP - General (General Practice)  DIAGNOSIS:  Encounter Diagnosis  Name Primary?   Primary malignant neoplasm of breast with metastasis ([place])     SUMMARY OF ONCOLOGIC HISTORY: Oncology History  Primary malignant neoplasm of breast with metastasis ([place])  09/25/2018 Initial Diagnosis   Right breast biopsy on 09/25/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    05/22/2019 PET scan   PET scan on 05/22/19 showed resolution of the multiple osseous metastatic lesions, right chest wall mass, and bilateral axillary lymphadenopathy.     Anti-estrogen oral therapy   Abemaciclib, Anastrozole and Zometa.  (Moved from [place])   09/10/2019 Genetic Testing   Negative genetic testing:  No pathogenic variants detected on the [facility] Common Hereditary Cancers panel. The report date is 09/10/2019.   The Common Hereditary Cancers Panel offered by [facility] includes sequencing and/or deletion duplication testing of the following 48 genes: APC, ATM, AXIN2, BARD1, BMPR1A, BRCA1, BRCA2, BRIP1, CDH1, CDK4, CDKN2A (p14ARF), CDKN2A (p16INK4a), CHEK2, CTNNA1, DICER1, EPCAM (Deletion/duplication testing only), GREM1 (promoter region deletion/duplication testing only), KIT, MEN1, MLH1, MSH2, MSH3, MSH6, MUTYH, NBN, NF1, NTHL1, PALB2, PDGFRA, PMS2, POLD1, POLE, PTEN, RAD50, RAD51C, RAD51D, RNF43, SDHB, SDHC, SDHD, SMAD4, SMARCA4. STK11, TP53, TSC1, TSC2, and VHL.  The following genes were evaluated for sequence changes only: SDHA and HOXB13 c.251G>A variant only.      CHIEF COMPLIANT: Follow-up breast cancer and Zometa  INTERVAL HISTORY: Norma Murray is a 71 y.o with the above-mentioned history of breast cancer currently on Zometa. She presents to the clinic for a follow-up. She states that she was having severe headaches before the dose was decreased. She reports that her right hand she has a lot of discomfort. She is tolerating the Verzenio  extremely well.    ALLERGIES:  has No Known Allergies.  MEDICATIONS:  Current Outpatient Medications  Medication Sig Dispense Refill   abemaciclib (VERZENIO) 100 MG tablet Take 1 tablet (100 mg total) by mouth 2 (two) times daily. Swallow tablets whole. Do not chew, crush, or split tablets before swallowing. 56 tablet 11   anastrozole (ARIMIDEX) 1 MG tablet TAKE 1 TABLET BY MOUTH EVERY DAY 90 tablet 3   cyclobenzaprine (FLEXERIL) 10 MG tablet Take 1 tablet (10 mg total) by mouth 3 (three) times daily as needed for muscle spasms. 20 tablet 0   levothyroxine (SYNTHROID) 50 MCG tablet TAKE 1 TABLET BY MOUTH DAILY BEFORE BREAKFAST 90 tablet 0   No current facility-administered medications for this visit.    PHYSICAL EXAMINATION: ECOG PERFORMANCE STATUS: 1 - Symptomatic but completely ambulatory  Vitals:   12/05/21 0832  BP: (!) 142/83  Pulse: 64  Resp: 16  Temp: 98.1 F (36.7 C)  SpO2: 100%   Filed Weights   12/05/21 0832  Weight: 142 lb 6.4 oz (64.6 kg)      LABORATORY DATA:  I have reviewed the data as listed    Latest Ref Rng & Units 12/05/2021    7:47 AM 09/01/2021    7:41 AM 06/02/2021    8:03 AM  CMP  Glucose 70 - 99 mg/dL 94  94  110   BUN 8 - 23 mg/dL _0 Creatinine 0.44 - 1.00 mg/dL 1.13  1.03  1.03   Sodium 135 - 145 mmol/L 138  140  141   Potassium 3.5 - 5.1 mmol/L 4.1  4.3  4.4   Chloride 98 - 111 mmol/L  108  109  109   CO2 22 - 32 mmol/L _0 Calcium 8.9 - 10.3 mg/dL 9.0  9.0  9.1   Total Protein 6.5 - 8.1 g/dL 6.6  6.8  6.4   Total Bilirubin 0.3 - 1.2 mg/dL 0.6  0.7  0.5   Alkaline Phos 38 - 126 U/L 56  59  63   AST 15 - 41 U/L 24  26  32   ALT 0 - 44 U/L 24  31  32     Lab Results  Component Value Date   WBC 4.0 12/05/2021   HGB 13.3 12/05/2021   HCT 38.3 12/05/2021   MCV 106.1 (H) 12/05/2021   PLT 143 (L) 12/05/2021   NEUTROABS 2.4 12/05/2021    ASSESSMENT & PLAN:  Primary malignant neoplasm of breast with metastasis  ([place]) Right breast biopsy on 09/25/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  Norma Murray moved from [place] because she could not tolerate the heat.   Current treatment: Abemaciclib, anastrozole and Zometa    Toxicities: 1.  Elevated LFTs: We reduced the dosage to 100 mg p.o. twice daily.  She is tolerating it extremely well with normal LFTs. 2. leukopenia/neutropenia: ANC 2 3.  Thrombocytopenia: Platelets 88: I suspect this is related to COVID-19 and its treatment.  We will continue with the same dosing. COVID-19 infection January 2022: Treated with antibiotic treatments.   Bone metastases: On Zometa every 3 months Zometa toxicity: She developed chills aches and pains for 2 days after Zometa infusion.  Therefore we will reduce the dosage of Zometa to 3 mg today.   PET CT scan 11/08/20: No evidence of recurrent breast cancer.  No evidence of metastatic disease in the chest abdomen pelvis.  Evidence of diffuse treated bone metastatic disease without hypermetabolism.   Hypothyroidism: on thyroid medication.   Continue current treatment 05/25/2021: PET CT scan: Diffuse skeletal bone metastases without metabolic activity consistent with treated breast cancer.  11/28/2021: PET/CT scan: No evidence of cancer recurrence or active metastases.  No change in the diffuse skeletal metastases.  Return to clinic every 3 months with labs Zometa and follow   No orders of the defined types were placed in this encounter.  The Norma Murray has a good understanding of the overall plan. she agrees with it. she will call with any problems that may develop before the next visit here. Total time spent: 30 mins including face to face time and time spent for planning, charting and co-ordination of care   Norma Ohara, MD 12/05/21    I Gardiner Coins am scribing for Dr. Lindi Adie  I have reviewed the above documentation for accuracy and completeness, and I agree with the  above.

## 2021-12-05 NOTE — Progress Notes (Signed)
Patient Care Team: Patient, [name] as PCP - General (General Practice)  DIAGNOSIS:  No diagnosis found.   SUMMARY OF ONCOLOGIC HISTORY: Oncology History  Primary malignant neoplasm of breast with metastasis ([place])  09/25/2018 Initial Diagnosis   Right breast biopsy on 09/25/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    05/22/2019 PET scan   PET scan on 05/22/19 showed resolution of the multiple osseous metastatic lesions, right chest wall mass, and bilateral axillary lymphadenopathy.     Anti-estrogen oral therapy   Abemaciclib, Anastrozole and Zometa.  (Moved from [place])   09/10/2019 Genetic Testing   Negative genetic testing:  No pathogenic variants detected on the [facility] Common Hereditary Cancers panel. The report date is 09/10/2019.   The Common Hereditary Cancers Panel offered by [facility] includes sequencing and/or deletion duplication testing of the following 48 genes: APC, ATM, AXIN2, BARD1, BMPR1A, BRCA1, BRCA2, BRIP1, CDH1, CDK4, CDKN2A (p14ARF), CDKN2A (p16INK4a), CHEK2, CTNNA1, DICER1, EPCAM (Deletion/duplication testing only), GREM1 (promoter region deletion/duplication testing only), KIT, MEN1, MLH1, MSH2, MSH3, MSH6, MUTYH, NBN, NF1, NTHL1, PALB2, PDGFRA, PMS2, POLD1, POLE, PTEN, RAD50, RAD51C, RAD51D, RNF43, SDHB, SDHC, SDHD, SMAD4, SMARCA4. STK11, TP53, TSC1, TSC2, and VHL.  The following genes were evaluated for sequence changes only: SDHA and HOXB13 c.251G>A variant only.      CHIEF COMPLIANT: Follow-up of metastatic breast cancer on Verzenio and anastrozole  INTERVAL HISTORY: Norma Murray is a 71 y.o. with above-mentioned history of metastatic breast cancer currently on treatment with Abemaciclib, anastrozole, and Zometa. She presents to the clinic today for follow-up. She states that she had chills, fever and aches for about 2 days after taking the Zometa.   ALLERGIES:  has No Known Allergies.  MEDICATIONS:  Current Outpatient  Medications  Medication Sig Dispense Refill   abemaciclib (VERZENIO) 100 MG tablet Take 1 tablet (100 mg total) by mouth 2 (two) times daily. Swallow tablets whole. Do not chew, crush, or split tablets before swallowing. 56 tablet 11   amoxicillin-clavulanate (AUGMENTIN) 875-125 MG tablet Take 1 tablet by mouth 2 (two) times daily. 14 tablet 0   anastrozole (ARIMIDEX) 1 MG tablet TAKE 1 TABLET BY MOUTH EVERY DAY 90 tablet 3   cyclobenzaprine (FLEXERIL) 10 MG tablet Take 1 tablet (10 mg total) by mouth 3 (three) times daily as needed for muscle spasms. 20 tablet 0   levothyroxine (SYNTHROID) 50 MCG tablet TAKE 1 TABLET BY MOUTH DAILY BEFORE BREAKFAST 90 tablet 0   No current facility-administered medications for this visit.    PHYSICAL EXAMINATION: ECOG PERFORMANCE STATUS: 1 - Symptomatic but completely ambulatory  There were no vitals filed for this visit.  There were no vitals filed for this visit.     LABORATORY DATA:  I have reviewed the data as listed    Latest Ref Rng & Units 09/01/2021    7:41 AM 06/02/2021    8:03 AM 03/02/2021    7:51 AM  CMP  Glucose 70 - 99 mg/dL 94  110  94   BUN 8 - 23 mg/dL _0 Creatinine 0.44 - 1.00 mg/dL 1.03  1.03  1.12   Sodium 135 - 145 mmol/L 140  141  139   Potassium 3.5 - 5.1 mmol/L 4.3  4.4  4.3   Chloride 98 - 111 mmol/L 109  109  106   CO2 22 - 32 mmol/L _1 Calcium 8.9 - 10.3 mg/dL 9.0  9.1  9.3   Total Protein 6.5 - 8.1 g/dL 6.8  6.4  6.8   Total Bilirubin 0.3 - 1.2 mg/dL 0.7  0.5  0.7   Alkaline Phos 38 - 126 U/L 59  63  68   AST 15 - 41 U/L 26  32  23   ALT 0 - 44 U/L 31  32  26     Lab Results  Component Value Date   WBC 4.0 12/05/2021   HGB 13.3 12/05/2021   HCT 38.3 12/05/2021   MCV 106.1 (H) 12/05/2021   PLT 143 (L) 12/05/2021   NEUTROABS 2.4 12/05/2021    ASSESSMENT & PLAN:  Primary malignant neoplasm of breast with metastasis ([place]) Right breast biopsy on 09/25/18 showed invasive ductal carcinoma,  grade 2, ER/PR positive, HER-2 negative.  Patient moved from [place] because she could not tolerate the heat.   Current treatment: Abemaciclib, anastrozole and Zometa    Toxicities: 1.  Elevated LFTs: We reduced the dosage to 100 mg p.o. twice daily.  She is tolerating it extremely well with normal LFTs. 2. leukopenia/neutropenia: ANC 2 3.  Thrombocytopenia: Platelets 88: I suspect this is related to COVID-19 and its treatment.  We will continue with the same dosing. COVID-19 infection January 2022: Treated with antibiotic treatments.   Bone metastases: On Zometa every 3 months Zometa toxicity: She developed chills aches and pains for 2 days after Zometa infusion.  Therefore we will reduce the dosage of Zometa to 3 mg today.  PET CT scan 11/08/20: No evidence of recurrent breast cancer.  No evidence of metastatic disease in the chest abdomen pelvis.  Evidence of diffuse treated bone metastatic disease without hypermetabolism.   Hypothyroidism: on thyroid medication.   Continue current treatment 05/25/2021: PET CT scan: Diffuse skeletal bone metastases without metabolic activity consistent with treated breast cancer. RTC in 3 months with labs and Zometa and PET/CT scans     No orders of the defined types were placed in this encounter.  The patient has a good understanding of the overall plan. she agrees with it. she will call with any problems that may develop before the next visit here. Total time spent: 30 mins including face to face time and time spent for planning, charting and co-ordination of care   Benay Pike, MD 12/05/21    I Gardiner Coins am scribing for Dr. Lindi Adie  I have reviewed the above documentation for accuracy and completeness, and I agree with the above.

## 2021-12-05 NOTE — Patient Instructions (Signed)

## 2021-12-05 NOTE — Assessment & Plan Note (Signed)
Right breast biopsy on 09/25/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 Patient moved from [place] because she could not tolerate the heat.  Current treatment:Abemaciclib, anastrozole and Zometa  Toxicities: 1.Elevated LFTs:We reducedthe dosage to100 mg p.o. twice daily. She is tolerating it extremely well with normal LFTs. 2.leukopenia/neutropenia: ANC2 3.Thrombocytopenia: Platelets88: I suspect this is related to COVID-19 and its treatment. We will continue with the same dosing. COVID-19 infection January 2022: Treated with antibiotic treatments.  Bone metastases: On Zometaevery 3 months Zometa toxicity: She developed chills aches and pains for 2 days after Zometa infusion.  Therefore we will reduce the dosage of Zometa to 3 mg today.  PET CT scan10/3/22: No evidence of recurrent breast cancer. No evidence of metastatic disease in the chest abdomen pelvis. Evidence of diffuse treated bone metastatic disease without hypermetabolism.  Hypothyroidism: onthyroid medication.  Continue current treatment 05/25/2021: PET CT scan: Diffuse skeletal bone metastases without metabolic activity consistent with treated breast cancer.  11/28/2021: PET/CT scan: No evidence of cancer recurrence or active metastases.  No change in the diffuse skeletal metastases.  Return to clinic every 3 months with labs Zometa and follow

## 2021-12-06 ENCOUNTER — Other Ambulatory Visit: Payer: Self-pay | Admitting: *Deleted

## 2021-12-06 ENCOUNTER — Encounter: Payer: Self-pay | Admitting: *Deleted

## 2021-12-06 LAB — THYROID PANEL WITH TSH
Free Thyroxine Index: 1.5 (ref 1.2–4.9)
T3 Uptake Ratio: 24 % (ref 24–39)
T4, Total: 6.3 ug/dL (ref 4.5–12.0)
TSH: 2.33 u[IU]/mL (ref 0.450–4.500)

## 2021-12-06 MED ORDER — LEVOTHYROXINE SODIUM 50 MCG PO TABS: 50.0000 ug | ORAL_TABLET | Freq: Every day | ORAL | 1 refills | Status: DC

## 2021-12-06 NOTE — Telephone Encounter (Signed)
Oral Oncology Patient Advocate Encounter  Signatures received from patient in lobby on 12/05/21. Application is pending final MD signature.  I will continue to check status until final determination.  Lady Deutscher, CPhT-Adv Oncology Pharmacy Patient [place] Direct Number: [phone]  Fax: [phone]

## 2021-12-06 NOTE — Progress Notes (Signed)
Per MD pt TSH WNL, no change in Levothyroxine dose at this time.  Refill sent to pharmacy on file.  Pt educated and verbalized understanding.

## 2021-12-07 ENCOUNTER — Other Ambulatory Visit (HOSPITAL_COMMUNITY): Payer: Self-pay

## 2021-12-07 ENCOUNTER — Telehealth: Payer: Self-pay | Admitting: *Deleted

## 2021-12-07 NOTE — Telephone Encounter (Signed)
Received call from pt with complaint of chills, fever, body aches and bilateral leg pain last night after Zometa infusion.  Pt states symptoms have resolved but is fatigued today.  Per MD pt needing to take OTC Tylenol and increase fluid intake today.  Pt educated to contact our office if symptoms become worse and verbalized understanding.

## 2021-12-07 NOTE — Telephone Encounter (Signed)
Oral Oncology Patient Advocate Encounter   Submitted application for assistance for Verzenio  to [employer] number [phone].   I will continue to check the status until final determination.   Norma Murray, CPhT-Adv Oncology Pharmacy Patient [place] Direct Number: [phone]  Fax: [phone]

## 2021-12-08 NOTE — Telephone Encounter (Signed)
Oral Oncology Patient Advocate Encounter   Received notification re-enrollment for assistance for Verzenio through [employer] has been approved. Patient may continue to receive their medication at $0 from this program.    [employer] phone number [phone].   Effective dates: 02/06/22 through 02/06/23  I have spoken to the patient.  Toriana Sponsel, CPhT-Adv Oncology Pharmacy Patient Advocate [facility] Direct Number: [phone]  Fax: [phone]   

## 2021-12-19 ENCOUNTER — Other Ambulatory Visit: Payer: Self-pay | Admitting: Hematology and Oncology

## 2021-12-19 DIAGNOSIS — C50919 Malignant neoplasm of unspecified site of unspecified female breast: Secondary | ICD-10-CM

## 2021-12-20 ENCOUNTER — Other Ambulatory Visit: Payer: Self-pay | Admitting: Hematology and Oncology

## 2022-01-24 ENCOUNTER — Other Ambulatory Visit: Payer: Self-pay | Admitting: Hematology and Oncology

## 2022-01-25 ENCOUNTER — Other Ambulatory Visit: Payer: Self-pay | Admitting: Hematology and Oncology

## 2022-01-25 DIAGNOSIS — C50919 Malignant neoplasm of unspecified site of unspecified female breast: Secondary | ICD-10-CM

## 2022-01-26 NOTE — Telephone Encounter (Signed)
Continued per Dr. Alvy Bimler most recent note. Next office visit 03/07/22.

## 2022-03-06 ENCOUNTER — Other Ambulatory Visit: Payer: Self-pay | Admitting: *Deleted

## 2022-03-06 DIAGNOSIS — C50919 Malignant neoplasm of unspecified site of unspecified female breast: Secondary | ICD-10-CM

## 2022-03-06 NOTE — Assessment & Plan Note (Signed)
Right breast biopsy on 09/25/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  Patient moved from [place] because she could not tolerate the heat.   Current treatment: Abemaciclib, anastrozole and Zometa    Toxicities: 1.  Elevated LFTs: We reduced the dosage to 100 mg p.o. twice daily.  She is tolerating it extremely well with normal LFTs. 2. leukopenia/neutropenia: ANC 2 3.  Thrombocytopenia: Platelets 88: I suspect this is related to COVID-19 and its treatment.  We will continue with the same dosing. COVID-19 infection January 2022: Treated with antibiotic treatments.   Bone metastases: On Zometa every 3 months Zometa toxicity: She developed chills aches and pains for 2 days after Zometa infusion.  Therefore we will reduce the dosage of Zometa to 3 mg today.   Hypothyroidism: on thyroid medication.   Continue current treatment 11/28/2021: PET CT scan: No significant interval change in bone mets. Scans annually  RTC in 3 months with labs and Zometa

## 2022-03-06 NOTE — Progress Notes (Signed)
Patient Care Team: Patient, [name] as PCP - General (General Practice)  DIAGNOSIS: No diagnosis found.  SUMMARY OF ONCOLOGIC HISTORY: Oncology History  Primary malignant neoplasm of breast with metastasis ([place])  09/25/2018 Initial Diagnosis   Right breast biopsy on 09/25/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    05/22/2019 PET scan   PET scan on 05/22/19 showed resolution of the multiple osseous metastatic lesions, right chest wall mass, and bilateral axillary lymphadenopathy.     Anti-estrogen oral therapy   Abemaciclib, Anastrozole and Zometa.  (Moved from [place])   09/10/2019 Genetic Testing   Negative genetic testing:  No pathogenic variants detected on the [facility] Common Hereditary Cancers panel. The report date is 09/10/2019.   The Common Hereditary Cancers Panel offered by [facility] includes sequencing and/or deletion duplication testing of the following 48 genes: APC, ATM, AXIN2, BARD1, BMPR1A, BRCA1, BRCA2, BRIP1, CDH1, CDK4, CDKN2A (p14ARF), CDKN2A (p16INK4a), CHEK2, CTNNA1, DICER1, EPCAM (Deletion/duplication testing only), GREM1 (promoter region deletion/duplication testing only), KIT, MEN1, MLH1, MSH2, MSH3, MSH6, MUTYH, NBN, NF1, NTHL1, PALB2, PDGFRA, PMS2, POLD1, POLE, PTEN, RAD50, RAD51C, RAD51D, RNF43, SDHB, SDHC, SDHD, SMAD4, SMARCA4. STK11, TP53, TSC1, TSC2, and VHL.  The following genes were evaluated for sequence changes only: SDHA and HOXB13 c.251G>A variant only.      CHIEF COMPLIANT:   INTERVAL HISTORY: Norma Murray is a   ALLERGIES:  has No Known Allergies.  MEDICATIONS:  Current Outpatient Medications  Medication Sig Dispense Refill   anastrozole (ARIMIDEX) 1 MG tablet TAKE 1 TABLET BY MOUTH EVERY DAY 90 tablet 3   cyclobenzaprine (FLEXERIL) 10 MG tablet TAKE 1 TABLET BY MOUTH THREE TIMES A DAY AS NEEDED 20 tablet 0   levothyroxine (SYNTHROID) 50 MCG tablet Take 1 tablet (50 mcg total) by mouth daily before  breakfast. 90 tablet 1   VERZENIO 100 MG tablet TAKE 1 TABLET (100 MG TOTAL) BY MOUTH 2 (TWO) TIMES DAILY. SWALLOW TABLETS WHOLE. DO NOT CHEW, CRUSH, OR SPLIT TABLETS BEFORE SWALLOWING. 56 tablet 0   No current facility-administered medications for this visit.    PHYSICAL EXAMINATION: ECOG PERFORMANCE STATUS: {CHL ONC ECOG [phone]}  There were no vitals filed for this visit. There were no vitals filed for this visit.  BREAST:*** No palpable masses or nodules in either right or left breasts. No palpable axillary supraclavicular or infraclavicular adenopathy no breast tenderness or nipple discharge. (exam performed in the presence of a chaperone)  LABORATORY DATA:  I have reviewed the data as listed    Latest Ref Rng & Units 12/05/2021    7:47 AM 09/01/2021    7:41 AM 06/02/2021    8:03 AM  CMP  Glucose 70 - 99 mg/dL 94  94  110   BUN 8 - 23 mg/dL '18  19  18   '$ Creatinine 0.44 - 1.00 mg/dL 1.13  1.03  1.03   Sodium 135 - 145 mmol/L 138  140  141   Potassium 3.5 - 5.1 mmol/L 4.1  4.3  4.4   Chloride 98 - 111 mmol/L 108  109  109   CO2 22 - 32 mmol/L '25  25  26   '$ Calcium 8.9 - 10.3 mg/dL 9.0  9.0  9.1   Total Protein 6.5 - 8.1 g/dL 6.6  6.8  6.4   Total Bilirubin 0.3 - 1.2 mg/dL 0.6  0.7  0.5   Alkaline Phos 38 - 126 U/L 56  59  63   AST 15 -  41 U/L 24  26  32   ALT 0 - 44 U/L 24  31  32     Lab Results  Component Value Date   WBC 4.0 12/05/2021   HGB 13.3 12/05/2021   HCT 38.3 12/05/2021   MCV 106.1 (H) 12/05/2021   PLT 143 (L) 12/05/2021   NEUTROABS 2.4 12/05/2021    ASSESSMENT & PLAN:  No problem-specific Assessment & Plan notes found for this encounter.    No orders of the defined types were placed in this encounter.  The patient has a good understanding of the overall plan. she agrees with it. she will call with any problems that may develop before the next visit here. Total time spent: 30 mins including face to face time and time spent for planning,  charting and co-ordination of care   Suzzette Righter, [place] 03/06/22    I Gardiner Coins am acting as a Education administrator for [employer]  ***

## 2022-03-07 ENCOUNTER — Inpatient Hospital Stay: Payer: Medicare PPO | Admitting: Hematology and Oncology

## 2022-03-07 ENCOUNTER — Inpatient Hospital Stay: Payer: Medicare PPO | Attending: Hematology and Oncology

## 2022-03-07 ENCOUNTER — Inpatient Hospital Stay: Payer: Medicare PPO

## 2022-03-07 VITALS — BP 137/87 | HR 73 | Temp 98.1°F | Resp 16 | Wt 143.2 lb

## 2022-03-07 DIAGNOSIS — C50911 Malignant neoplasm of unspecified site of right female breast: Secondary | ICD-10-CM | POA: Insufficient documentation

## 2022-03-07 DIAGNOSIS — C7951 Secondary malignant neoplasm of bone: Secondary | ICD-10-CM | POA: Insufficient documentation

## 2022-03-07 DIAGNOSIS — C50919 Malignant neoplasm of unspecified site of unspecified female breast: Secondary | ICD-10-CM

## 2022-03-07 DIAGNOSIS — Z79899 Other long term (current) drug therapy: Secondary | ICD-10-CM | POA: Diagnosis not present

## 2022-03-07 LAB — CMP (CANCER CENTER ONLY)
ALT: 31 U/L (ref 0–44)
AST: 25 U/L (ref 15–41)
Albumin: 4.1 g/dL (ref 3.5–5.0)
Alkaline Phosphatase: 88 U/L (ref 38–126)
Anion gap: 5 (ref 5–15)
BUN: 17 mg/dL (ref 8–23)
CO2: 25 mmol/L (ref 22–32)
Calcium: 9.5 mg/dL (ref 8.9–10.3)
Chloride: 110 mmol/L (ref 98–111)
Creatinine: 0.97 mg/dL (ref 0.44–1.00)
GFR, Estimated: >60 mL/min (ref >60)
Glucose, Bld: 92 mg/dL (ref 70–99)
Potassium: 3.7 mmol/L (ref 3.5–5.1)
Sodium: 140 mmol/L (ref 135–145)
Total Bilirubin: 0.8 mg/dL (ref 0.3–1.2)
Total Protein: 6.9 g/dL (ref 6.5–8.1)

## 2022-03-07 LAB — CBC WITH DIFFERENTIAL (CANCER CENTER ONLY)
Abs Immature Granulocytes: 0.01 10*3/uL (ref 0.00–0.07)
Basophils Absolute: 0.1 10*3/uL (ref 0.0–0.1)
Basophils Relative: 2 %
Eosinophils Absolute: 0.2 10*3/uL (ref 0.0–0.5)
Eosinophils Relative: 4 %
HCT: 40.1 % (ref 36.0–46.0)
Hemoglobin: 14.1 g/dL (ref 12.0–15.0)
Immature Granulocytes: 0 %
Lymphocytes Relative: 29 %
Lymphs Abs: 1.1 10*3/uL (ref 0.7–4.0)
MCH: 36.6 pg — ABNORMAL HIGH (ref 26.0–34.0)
MCHC: 35.2 g/dL (ref 30.0–36.0)
MCV: 104.2 fL — ABNORMAL HIGH (ref 80.0–100.0)
Monocytes Absolute: 0.4 10*3/uL (ref 0.1–1.0)
Monocytes Relative: 11 %
Neutro Abs: 2 10*3/uL (ref 1.7–7.7)
Neutrophils Relative %: 54 %
Platelet Count: 134 10*3/uL — ABNORMAL LOW (ref 150–400)
RBC: 3.85 MIL/uL — ABNORMAL LOW (ref 3.87–5.11)
RDW: 12.8 % (ref 11.5–15.5)
WBC Count: 3.7 10*3/uL — ABNORMAL LOW (ref 4.0–10.5)
nRBC: 0 % (ref 0.0–0.2)

## 2022-03-07 MED ORDER — SODIUM CHLORIDE 0.9 % IV SOLN: Freq: Once | INTRAVENOUS | Status: AC

## 2022-03-07 MED ORDER — ZOLEDRONIC ACID 4 MG/5ML IV CONC
3.0000 mg | Freq: Once | INTRAVENOUS | Status: AC
  Administered 2022-03-07: 3 mg via INTRAVENOUS
  Filled 2022-03-07: qty 3.75

## 2022-03-07 NOTE — Patient Instructions (Signed)

## 2022-03-09 ENCOUNTER — Telehealth: Payer: Self-pay | Admitting: *Deleted

## 2022-03-09 LAB — THYROID PANEL WITH TSH
Free Thyroxine Index: 2.3 (ref 1.2–4.9)
T3 Uptake Ratio: 27 % (ref 24–39)
T4, Total: 8.6 ug/dL (ref 4.5–12.0)
TSH: 1.75 u[IU]/mL (ref 0.450–4.500)

## 2022-03-09 NOTE — Telephone Encounter (Signed)
Received call from pt stating she experienced reaction to Zometa yesterday evening.  Pt states her oral temperature was 102.0 and has improved with OTC Tylenol. Pt states she also experienced severe body aches and pains but is being controlled with OTC Tylenol.  MD notified and verbalized understanding.

## 2022-05-16 ENCOUNTER — Encounter (HOSPITAL_COMMUNITY)
Admission: RE | Admit: 2022-05-16 | Discharge: 2022-05-16 | Disposition: A | Discharge status: Home / Self Care | Payer: Medicare PPO | Source: Ambulatory Visit | Attending: Hematology and Oncology | Admitting: Hematology and Oncology

## 2022-05-16 DIAGNOSIS — C7951 Secondary malignant neoplasm of bone: Secondary | ICD-10-CM | POA: Diagnosis not present

## 2022-05-16 DIAGNOSIS — C50919 Malignant neoplasm of unspecified site of unspecified female breast: Secondary | ICD-10-CM | POA: Insufficient documentation

## 2022-05-16 LAB — GLUCOSE, CAPILLARY: Glucose-Capillary: 90 mg/dL (ref 70–99)

## 2022-05-16 MED ORDER — FLUDEOXYGLUCOSE F - 18 (FDG) INJECTION
7.1000 | Freq: Once | INTRAVENOUS | Status: AC
  Administered 2022-05-16: 7.01 via INTRAVENOUS

## 2022-05-18 NOTE — Progress Notes (Signed)
Patient Care Team: Patient, [name] as PCP - General (General Practice)  DIAGNOSIS:  Encounter Diagnosis  Name Primary?   Primary malignant neoplasm of breast with metastasis Yes    SUMMARY OF ONCOLOGIC HISTORY: Oncology History  Primary malignant neoplasm of breast with metastasis  09/25/2018 Initial Diagnosis   Right breast biopsy on 09/25/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    05/22/2019 PET scan   PET scan on 05/22/19 showed resolution of the multiple osseous metastatic lesions, right chest wall mass, and bilateral axillary lymphadenopathy.     Anti-estrogen oral therapy   Abemaciclib, Anastrozole and Zometa.  (Moved from [place]   09/10/2019 Genetic Testing   Negative genetic testing:  No pathogenic variants detected on the [facility] Common Hereditary Cancers panel. The report date is 09/10/2019.   The Common Hereditary Cancers Panel offered by [facility] includes sequencing and/or deletion duplication testing of the following 48 genes: APC, ATM, AXIN2, BARD1, BMPR1A, BRCA1, BRCA2, BRIP1, CDH1, CDK4, CDKN2A (p14ARF), CDKN2A (p16INK4a), CHEK2, CTNNA1, DICER1, EPCAM (Deletion/duplication testing only), GREM1 (promoter region deletion/duplication testing only), KIT, MEN1, MLH1, MSH2, MSH3, MSH6, MUTYH, NBN, NF1, NTHL1, PALB2, PDGFRA, PMS2, POLD1, POLE, PTEN, RAD50, RAD51C, RAD51D, RNF43, SDHB, SDHC, SDHD, SMAD4, SMARCA4. STK11, TP53, TSC1, TSC2, and VHL.  The following genes were evaluated for sequence changes only: SDHA and HOXB13 c.251G>A variant only.      CHIEF COMPLIANT: Follow-up of metastatic breast cancer Zometa  and anastrozole/ Scans  INTERVAL HISTORY: Norma Murray is a 72 y.o. with above-mentioned history of metastatic breast cancer currently on treatment with Abemaciclib, anastrozole, and Zometa. She presents to the clinic today for follow-up. She reports that she aches in her hands and feet and it is causing her to feel unconformable.  She states that the last couple of months she has been depressed and emotional.   ALLERGIES:  has No Known Allergies.  MEDICATIONS:  Current Outpatient Medications  Medication Sig Dispense Refill   anastrozole (ARIMIDEX) 1 MG tablet TAKE 1 TABLET BY MOUTH EVERY DAY 90 tablet 3   cyclobenzaprine (FLEXERIL) 10 MG tablet TAKE 1 TABLET BY MOUTH THREE TIMES A DAY AS NEEDED 20 tablet 0   escitalopram (LEXAPRO) 10 MG tablet Take 1 tablet (10 mg total) by mouth daily. 90 tablet 3   levothyroxine (SYNTHROID) 50 MCG tablet Take 1 tablet (50 mcg total) by mouth daily before breakfast. 90 tablet 1   VERZENIO 100 MG tablet TAKE 1 TABLET (100 MG TOTAL) BY MOUTH 2 (TWO) TIMES DAILY. SWALLOW TABLETS WHOLE. DO NOT CHEW, CRUSH, OR SPLIT TABLETS BEFORE SWALLOWING. 56 tablet 0   No current facility-administered medications for this visit.    PHYSICAL EXAMINATION: ECOG PERFORMANCE STATUS: 1 - Symptomatic but completely ambulatory  Vitals:   05/19/22 0806  BP: (!) 122/55  Pulse: 85  Resp: 16  Temp: 97.9 F (36.6 C)  SpO2: 100%   Filed Weights   05/19/22 0806  Weight: 146 lb 11.2 oz (66.5 kg)   LABORATORY DATA:  I have reviewed the data as listed    Latest Ref Rng & Units 05/19/2022    7:51 AM 03/07/2022    7:47 AM 12/05/2021    7:47 AM  CMP  Glucose 70 - 99 mg/dL 93  92  94   BUN 8 - 23 mg/dL 16  17  18    Creatinine 0.44 - 1.00 mg/dL 1.611.11  0.960.97  0.451.13   Sodium 135 - 145 mmol/L 140  140  138  Potassium 3.5 - 5.1 mmol/L 4.1  3.7  4.1   Chloride 98 - 111 mmol/L 109  110  108   CO2 22 - 32 mmol/L 25  25  25    Calcium 8.9 - 10.3 mg/dL 16.1  9.5  9.0   Total Protein 6.5 - 8.1 g/dL 6.7  6.9  6.6   Total Bilirubin 0.3 - 1.2 mg/dL 0.5  0.8  0.6   Alkaline Phos 38 - 126 U/L 79  88  56   AST 15 - 41 U/L 34  25  24   ALT 0 - 44 U/L 44  31  24     Lab Results  Component Value Date   WBC 3.6 (L) 05/19/2022   HGB 14.0 05/19/2022   HCT 40.4 05/19/2022   MCV 106.3 (H) 05/19/2022   PLT 144 (L)  05/19/2022   NEUTROABS 2.0 05/19/2022    ASSESSMENT & PLAN:  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  Patient moved from [place] because she could not tolerate the heat.   Current treatment: Abemaciclib, anastrozole and Zometa    Toxicities: 1.  Elevated LFTs: Currently on 100 mg p.o. twice daily.  She is tolerating it extremely well with normal LFTs. 2. leukopenia/neutropenia: ANC 2 3.  Thrombocytopenia: Platelets 134 today   Bone metastases: On Zometa every 3 months Zometa toxicity: She developed chills aches and pains for 2 days after Zometa infusion.  Therefore we will reduce the dosage of Zometa to 3 mg    Hypothyroidism: on thyroid medication. Depression:Starting her on Lexapro   Continue current treatment 11/28/2021: PET CT scan: No significant interval change in bone mets. 05/17/2022: PET CT scan: No evidence of progressive metastatic disease.  Similar bone metastases without hypermetabolism, hepatic steatosis   RTC in 3 months with labs and Zometa.    No orders of the defined types were placed in this encounter.  The patient has a good understanding of the overall plan. she agrees with it. she will call with any problems that may develop before the next visit here. Total time spent: 30 mins including face to face time and time spent for planning, charting and co-ordination of care   Tamsen Meek, MD 05/19/22    I Janan Ridge am acting as a Neurosurgeon for [employer]  I have reviewed the above documentation for accuracy and completeness, and I agree with the above.

## 2022-05-19 ENCOUNTER — Inpatient Hospital Stay: Payer: Medicare PPO

## 2022-05-19 ENCOUNTER — Inpatient Hospital Stay: Payer: Medicare PPO | Attending: Hematology and Oncology

## 2022-05-19 ENCOUNTER — Inpatient Hospital Stay (HOSPITAL_BASED_OUTPATIENT_CLINIC_OR_DEPARTMENT_OTHER): Payer: Medicare PPO | Admitting: Hematology and Oncology

## 2022-05-19 VITALS — BP 122/55 | HR 85 | Temp 97.9°F | Resp 16 | Ht 65.0 in | Wt 146.7 lb

## 2022-05-19 DIAGNOSIS — C50911 Malignant neoplasm of unspecified site of right female breast: Secondary | ICD-10-CM | POA: Insufficient documentation

## 2022-05-19 DIAGNOSIS — C7951 Secondary malignant neoplasm of bone: Secondary | ICD-10-CM | POA: Insufficient documentation

## 2022-05-19 DIAGNOSIS — D6959 Other secondary thrombocytopenia: Secondary | ICD-10-CM | POA: Diagnosis not present

## 2022-05-19 DIAGNOSIS — Z79811 Long term (current) use of aromatase inhibitors: Secondary | ICD-10-CM | POA: Diagnosis not present

## 2022-05-19 DIAGNOSIS — E039 Hypothyroidism, unspecified: Secondary | ICD-10-CM | POA: Diagnosis not present

## 2022-05-19 DIAGNOSIS — Z79890 Hormone replacement therapy (postmenopausal): Secondary | ICD-10-CM | POA: Diagnosis not present

## 2022-05-19 DIAGNOSIS — C50919 Malignant neoplasm of unspecified site of unspecified female breast: Secondary | ICD-10-CM

## 2022-05-19 DIAGNOSIS — Z170 Estrogen receptor positive status [ER+]: Secondary | ICD-10-CM | POA: Diagnosis not present

## 2022-05-19 DIAGNOSIS — F32A Depression, unspecified: Secondary | ICD-10-CM | POA: Insufficient documentation

## 2022-05-19 LAB — CMP (CANCER CENTER ONLY)
ALT: 44 U/L (ref 0–44)
AST: 34 U/L (ref 15–41)
Albumin: 4.1 g/dL (ref 3.5–5.0)
Alkaline Phosphatase: 79 U/L (ref 38–126)
Anion gap: 6 (ref 5–15)
BUN: 16 mg/dL (ref 8–23)
CO2: 25 mmol/L (ref 22–32)
Calcium: 10 mg/dL (ref 8.9–10.3)
Chloride: 109 mmol/L (ref 98–111)
Creatinine: 1.11 mg/dL — ABNORMAL HIGH (ref 0.44–1.00)
GFR, Estimated: 53 mL/min — ABNORMAL LOW (ref >60)
Glucose, Bld: 93 mg/dL (ref 70–99)
Potassium: 4.1 mmol/L (ref 3.5–5.1)
Sodium: 140 mmol/L (ref 135–145)
Total Bilirubin: 0.5 mg/dL (ref 0.3–1.2)
Total Protein: 6.7 g/dL (ref 6.5–8.1)

## 2022-05-19 LAB — CBC WITH DIFFERENTIAL (CANCER CENTER ONLY)
Abs Immature Granulocytes: 0.01 10*3/uL (ref 0.00–0.07)
Basophils Absolute: 0.1 10*3/uL (ref 0.0–0.1)
Basophils Relative: 2 %
Eosinophils Absolute: 0.2 10*3/uL (ref 0.0–0.5)
Eosinophils Relative: 4 %
HCT: 40.4 % (ref 36.0–46.0)
Hemoglobin: 14 g/dL (ref 12.0–15.0)
Immature Granulocytes: 0 %
Lymphocytes Relative: 26 %
Lymphs Abs: 0.9 10*3/uL (ref 0.7–4.0)
MCH: 36.8 pg — ABNORMAL HIGH (ref 26.0–34.0)
MCHC: 34.7 g/dL (ref 30.0–36.0)
MCV: 106.3 fL — ABNORMAL HIGH (ref 80.0–100.0)
Monocytes Absolute: 0.5 10*3/uL (ref 0.1–1.0)
Monocytes Relative: 13 %
Neutro Abs: 2 10*3/uL (ref 1.7–7.7)
Neutrophils Relative %: 55 %
Platelet Count: 144 10*3/uL — ABNORMAL LOW (ref 150–400)
RBC: 3.8 MIL/uL — ABNORMAL LOW (ref 3.87–5.11)
RDW: 12.7 % (ref 11.5–15.5)
WBC Count: 3.6 10*3/uL — ABNORMAL LOW (ref 4.0–10.5)
nRBC: 0 % (ref 0.0–0.2)

## 2022-05-19 MED ORDER — ESCITALOPRAM OXALATE 10 MG PO TABS: 10.0000 mg | ORAL_TABLET | Freq: Every day | ORAL | 3 refills | Status: DC

## 2022-05-19 MED ORDER — ZOLEDRONIC ACID 4 MG/5ML IV CONC
3.0000 mg | Freq: Once | INTRAVENOUS | Status: AC
  Administered 2022-05-19: 3 mg via INTRAVENOUS
  Filled 2022-05-19: qty 3.75

## 2022-05-19 NOTE — Assessment & Plan Note (Signed)
Right breast biopsy on 09/25/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  Patient moved from [place] because she could not tolerate the heat.   Current treatment: Abemaciclib, anastrozole and Zometa    Toxicities: 1.  Elevated LFTs: Currently on 100 mg p.o. twice daily.  She is tolerating it extremely well with normal LFTs. 2. leukopenia/neutropenia: ANC 2 3.  Thrombocytopenia: Platelets 134 today   Bone metastases: On Zometa every 3 months Zometa toxicity: She developed chills aches and pains for 2 days after Zometa infusion.  Therefore we will reduce the dosage of Zometa to 3 mg    Hypothyroidism: on thyroid medication.   Continue current treatment 11/28/2021: PET CT scan: No significant interval change in bone mets. 05/17/2022: PET CT scan: No evidence of progressive metastatic disease.  Similar bone metastases without hypermetabolism, hepatic steatosis   RTC in 3 months with labs and Zometa.

## 2022-05-20 LAB — THYROID PANEL WITH TSH
Free Thyroxine Index: 1.9 (ref 1.2–4.9)
T3 Uptake Ratio: 25 % (ref 24–39)
T4, Total: 7.5 ug/dL (ref 4.5–12.0)
TSH: 2.31 u[IU]/mL (ref 0.450–4.500)

## 2022-05-25 ENCOUNTER — Telehealth: Payer: Self-pay

## 2022-05-25 ENCOUNTER — Other Ambulatory Visit: Payer: Self-pay | Admitting: Hematology and Oncology

## 2022-05-25 NOTE — Telephone Encounter (Signed)
Called Pt regarding synthroid rx refill request. Per MD, we will refill for 30 day supply with the expectation that Pt will find a PCP in that time to take over thyroid care. Pt verbalized understanding.

## 2022-06-12 ENCOUNTER — Telehealth: Payer: Self-pay

## 2022-06-12 NOTE — Telephone Encounter (Signed)
Pt called to let us know she obtained PCP with a NP. Pt asks if it is OK for her to see a NP. Advised pt this was fine. She knows to call with any further questions or concerns.

## 2022-06-13 NOTE — Progress Notes (Unsigned)
   New Patient Office Visit  Subjective    Patient ID: Norma Murray, female    DOB: 01/12/1951  Age: 72 y.o. MRN: [id number]  CC: No chief complaint on file.   HPI Norma Murray presents to establish care with new provider and chronic management.   Patients previous primary care provider was   Specialist: [facility] with Dr. Trudee Kuster for metastatic breast cancer.     Outpatient Encounter Medications as of 06/14/2022  Medication Sig   anastrozole (ARIMIDEX) 1 MG tablet TAKE 1 TABLET BY MOUTH EVERY DAY   cyclobenzaprine (FLEXERIL) 10 MG tablet TAKE 1 TABLET BY MOUTH THREE TIMES A DAY AS NEEDED   escitalopram (LEXAPRO) 10 MG tablet Take 1 tablet (10 mg total) by mouth daily.   levothyroxine (SYNTHROID) 50 MCG tablet TAKE 1 TABLET BY MOUTH DAILY BEFORE BREAKFAST   VERZENIO 100 MG tablet TAKE 1 TABLET (100 MG TOTAL) BY MOUTH 2 (TWO) TIMES DAILY. SWALLOW TABLETS WHOLE. DO NOT CHEW, CRUSH, OR SPLIT TABLETS BEFORE SWALLOWING.   No facility-administered encounter medications on file as of 06/14/2022.    Past Medical History:  Diagnosis Date   Family history of bone cancer    Family history of breast cancer    Family history of lung cancer     No past surgical history on file.  Family History  Problem Relation Age of Onset   Breast cancer Maternal Aunt 30   Mesothelioma Maternal Uncle        worked on railroad   Cirrhosis Maternal Grandfather        liver   Breast cancer Maternal Aunt 40   Lung cancer Maternal Uncle 15       smoker    Social History   Socioeconomic History   Marital status: Married    Spouse name: Not on file   Number of children: Not on file   Years of education: Not on file   Highest education level: Not on file  Occupational History   Not on file  Tobacco Use   Smoking status: Never   Smokeless tobacco: Never  Substance and Sexual Activity   Alcohol use: Not on file   Drug use: Not on file   Sexual  activity: Not on file  Other Topics Concern   Not on file  Social History Narrative   Not on file   Social Determinants of Health   Financial Resource Strain: Not on file  Food Insecurity: Not on file  Transportation Needs: Not on file  Physical Activity: Not on file  Stress: Not on file  Social Connections: Not on file  Intimate Partner Violence: Not on file    ROS See HPI above    Objective    There were no vitals taken for this visit.  Physical Exam    Assessment & Plan:  Encounter to establish care    No follow-ups on file.   Zandra Abts, NP

## 2022-06-14 ENCOUNTER — Ambulatory Visit: Payer: Medicare PPO | Admitting: Family Medicine

## 2022-06-14 ENCOUNTER — Encounter: Payer: Self-pay | Admitting: Family Medicine

## 2022-06-14 ENCOUNTER — Telehealth: Payer: Self-pay | Admitting: *Deleted

## 2022-06-14 ENCOUNTER — Telehealth: Payer: Self-pay | Admitting: Family Medicine

## 2022-06-14 VITALS — BP 130/74 | HR 77 | Temp 98.5°F | Ht 65.0 in | Wt 144.2 lb

## 2022-06-14 DIAGNOSIS — G629 Polyneuropathy, unspecified: Secondary | ICD-10-CM | POA: Diagnosis not present

## 2022-06-14 DIAGNOSIS — E039 Hypothyroidism, unspecified: Secondary | ICD-10-CM | POA: Diagnosis not present

## 2022-06-14 DIAGNOSIS — E348 Other specified endocrine disorders: Secondary | ICD-10-CM | POA: Diagnosis not present

## 2022-06-14 DIAGNOSIS — Z7689 Persons encountering health services in other specified circumstances: Secondary | ICD-10-CM

## 2022-06-14 MED ORDER — LEVOTHYROXINE SODIUM 50 MCG PO TABS
50.0000 ug | ORAL_TABLET | Freq: Every day | ORAL | 0 refills | Status: DC
Start: 2022-06-14 — End: 2022-09-12

## 2022-06-14 NOTE — Assessment & Plan Note (Signed)
Continue Levothyroxine daily. Refilled medication. TSH 3 weeks ago was stable.

## 2022-06-14 NOTE — Telephone Encounter (Signed)
Caller name: Valois Palmiter  On DPR?: Yes  Call back number: [phone] (home)  Provider they see: Alveria Apley, NP  Reason for call:   Gabapentin is alright for neuropathy and you can prescribe and what mg are you choosing you can call pt back with info - Advise

## 2022-06-14 NOTE — Telephone Encounter (Signed)
Pt made appt for 06/15/2022

## 2022-06-14 NOTE — Patient Instructions (Addendum)
It was a pleasure to meet you today and I look forward to taking care of you. -Ordered bone density scan.  -Refilled Levothyroxine for hypothyroidism. -Follow up in 6 months for physical and AWV visit over the phone with Raynelle Fanning, LPN. -Have safe travels to [place].

## 2022-06-14 NOTE — Telephone Encounter (Signed)
Received call from pt stating her PCP would like to start her on Gabapentin for neuropathy.  Pt requesting advice from MD if okay to proceed while on Verzenio.  Per MD okay to proceed with tx.  Pt educated and verbalized understanding.

## 2022-06-15 ENCOUNTER — Encounter: Payer: Self-pay | Admitting: Family Medicine

## 2022-06-15 ENCOUNTER — Ambulatory Visit (INDEPENDENT_AMBULATORY_CARE_PROVIDER_SITE_OTHER): Payer: Medicare PPO | Admitting: Family Medicine

## 2022-06-15 VITALS — BP 112/72 | HR 71 | Temp 98.7°F | Ht 65.0 in | Wt 144.0 lb

## 2022-06-15 DIAGNOSIS — R200 Anesthesia of skin: Secondary | ICD-10-CM

## 2022-06-15 DIAGNOSIS — R202 Paresthesia of skin: Secondary | ICD-10-CM | POA: Diagnosis not present

## 2022-06-15 DIAGNOSIS — G629 Polyneuropathy, unspecified: Secondary | ICD-10-CM | POA: Insufficient documentation

## 2022-06-15 LAB — VITAMIN B12: Vitamin B-12: 214 pg/mL (ref 211–911)

## 2022-06-15 MED ORDER — GABAPENTIN 100 MG PO CAPS: 100.0000 mg | ORAL_CAPSULE | Freq: Two times a day (BID) | ORAL | 0 refills | Status: DC

## 2022-06-15 NOTE — Patient Instructions (Signed)
-  START Gabapentin 100mg , 1 tablet twice a day. Initially start with 1 tablet at bedtime.  -Provided written instructions about Gabapentin.  -Ordered a vitamin B12 lab to assess for a reason for the numbness and tingling in your feet. Office will call with results and you may see them on MyChart.  -Follow up in 1 month to see how the medication is working.

## 2022-06-15 NOTE — Assessment & Plan Note (Signed)
Prescribed Gabapentin 100mg , 1 tablet twice a day. Initially start with 1 tablet at bedtime. Discussed about side effects of medication and provided written instructions about Gabapentin. Ordered a vitamin B12 lab to assess for a reason for the numbness and tingling in her feet since she has not had a vitamin B12 checked.

## 2022-06-15 NOTE — Progress Notes (Signed)
   Established Patient Office Visit   Subjective:  Patient ID: Norma Murray, female    DOB: 08-15-1950  Age: 72 y.o. MRN: [id number]  Chief Complaint  Patient presents with   Follow-up    Pt is here today to discuss medication for Neuropathy.     HPI Neuropathy pain: Patient reports mostly at night, every night, she will experience pain in feet and legs. She also experiences constant numbness and tingling in feet all the time. Described as stuffiness, spasms,  and sometimes sharp. She reports this started about 8 months ago, but just mentioned it to her oncologist at her last visit. He recommend over the counter Tumeric, which has not helped. She messaged oncologist yesterday to possibly try Gabapentin from this providers suggestion from yesterdays initial visit.   ROS See HPI above     Objective:   BP 112/72   Pulse 71   Temp 98.7 F (37.1 C)   Ht 5\' 5"  (1.651 m)   Wt 144 lb (65.3 kg)   SpO2 98%   BMI 23.96 kg/m    Physical Exam Vitals reviewed.  Constitutional:      General: She is not in acute distress.    Appearance: Normal appearance. She is not ill-appearing, toxic-appearing or diaphoretic.  Eyes:     General:        Right eye: No discharge.        Left eye: No discharge.     Conjunctiva/sclera: Conjunctivae normal.  Cardiovascular:     Rate and Rhythm: Normal rate and regular rhythm.     Heart sounds: Normal heart sounds. No murmur heard.    No friction rub. No gallop.  Pulmonary:     Effort: Pulmonary effort is normal. No respiratory distress.     Breath sounds: Normal breath sounds.  Musculoskeletal:        General: Normal range of motion.     Right lower leg: No edema.     Left lower leg: No edema.  Skin:    General: Skin is warm and dry.  Neurological:     General: No focal deficit present.     Mental Status: She is alert and oriented to person, place, and time. Mental status is at baseline.  Psychiatric:        Mood and Affect: Mood normal.         Behavior: Behavior normal.        Thought Content: Thought content normal.        Judgment: Judgment normal.      Assessment & Plan:  Neuropathy Assessment & Plan: Prescribed Gabapentin 100mg , 1 tablet twice a day. Initially start with 1 tablet at bedtime. Discussed about side effects of medication and provided written instructions about Gabapentin. Ordered a vitamin B12 lab to assess for a reason for the numbness and tingling in her feet since she has not had a vitamin B12 checked.   Orders: -     Gabapentin; Take 1 capsule (100 mg total) by mouth 2 (two) times daily.  Dispense: 60 capsule; Refill: 0  Numbness and tingling of both feet -     Vitamin B12  -Will follow up in 1 month to see how she is doing with Gabapentin.  Return in about 1 month (around 07/16/2022) for follow-up.   Zandra Abts, NP

## 2022-06-16 ENCOUNTER — Telehealth: Payer: Self-pay

## 2022-06-16 NOTE — Telephone Encounter (Signed)
-----   Message from Alveria Apley, NP sent at 06/16/2022  7:57 AM EDT ----- Your vitamin B12 is barely normal. You may benefit from an over the counter Vitamin B12 supplement of 1,053mcg daily.

## 2022-06-17 ENCOUNTER — Other Ambulatory Visit: Payer: Self-pay | Admitting: Hematology and Oncology

## 2022-06-19 ENCOUNTER — Telehealth: Payer: Self-pay

## 2022-06-19 ENCOUNTER — Other Ambulatory Visit: Payer: Self-pay | Admitting: Hematology and Oncology

## 2022-06-19 MED ORDER — CYCLOBENZAPRINE HCL 10 MG PO TABS: 10.0000 mg | ORAL_TABLET | Freq: Three times a day (TID) | ORAL | 0 refills | Status: DC | PRN

## 2022-06-19 NOTE — Telephone Encounter (Signed)
Returned Pt's call regarding flexaril rx refill request r/t back pain. Advised Pt that we will refill request now but that Pt will need to seek further refills from PCP. Pt verbalized understanding.

## 2022-06-20 ENCOUNTER — Telehealth: Payer: Self-pay

## 2022-06-20 NOTE — Telephone Encounter (Signed)
Patient reports she forgot to mention a medication the other day during her visit and needs to add it and would like to discuss this medication with the medical assistant for [facility]

## 2022-06-20 NOTE — Telephone Encounter (Signed)
Pt reports she has new RX for Flexeril. She is having spasms in her back. Pt reports she may need you to take over this RX.

## 2022-07-13 ENCOUNTER — Ambulatory Visit (INDEPENDENT_AMBULATORY_CARE_PROVIDER_SITE_OTHER): Payer: Medicare PPO | Admitting: *Deleted

## 2022-07-13 DIAGNOSIS — Z0000 Encounter for general adult medical examination without abnormal findings: Secondary | ICD-10-CM | POA: Diagnosis not present

## 2022-07-13 NOTE — Patient Instructions (Signed)
Norma Murray , Thank you for taking time to come for your Medicare Wellness Visit. I appreciate your ongoing commitment to your health goals. Please review the following plan we discussed and let me know if I can assist you in the future.   Screening recommendations/referrals: Colonoscopy: Education provided Mammogram:  Bone Density: call to schedule when ready Recommended yearly ophthalmology/optometry visit for glaucoma screening and checkup Recommended yearly dental visit for hygiene and checkup  Vaccinations: Influenza vaccine: - Pneumococcal vaccine: - Tdap vaccine: Education provided Shingles vaccine: -    Advanced directives: Education provided     Preventive Care 65 Years and Older, Female Preventive care refers to lifestyle choices and visits with your health care provider that can promote health and wellness. What does preventive care include? A yearly physical exam. This is also called an annual well check. Dental exams once or twice a year. Routine eye exams. Ask your health care provider how often you should have your eyes checked. Personal lifestyle choices, including: Daily care of your teeth and gums. Regular physical activity. Eating a healthy diet. Avoiding tobacco and drug use. Limiting alcohol use. Practicing safe sex. Taking low-dose aspirin every day. Taking vitamin and mineral supplements as recommended by your health care provider. What happens during an annual well check? The services and screenings done by your health care provider during your annual well check will depend on your age, overall health, lifestyle risk factors, and family history of disease. Counseling  Your health care provider may ask you questions about your: Alcohol use. Tobacco use. Drug use. Emotional well-being. Home and relationship well-being. Sexual activity. Eating habits. History of falls. Memory and ability to understand (cognition). Work and work  Astronomer. Reproductive health. Screening  You may have the following tests or measurements: Height, weight, and BMI. Blood pressure. Lipid and cholesterol levels. These may be checked every 5 years, or more frequently if you are over 73 years old. Skin check. Lung cancer screening. You may have this screening every year starting at age 76 if you have a 30-pack-year history of smoking and currently smoke or have quit within the past 15 years. Fecal occult blood test (FOBT) of the stool. You may have this test every year starting at age 6. Flexible sigmoidoscopy or colonoscopy. You may have a sigmoidoscopy every 5 years or a colonoscopy every 10 years starting at age 30. Hepatitis C blood test. Hepatitis B blood test. Sexually transmitted disease (STD) testing. Diabetes screening. This is done by checking your blood sugar (glucose) after you have not eaten for a while (fasting). You may have this done every 1-3 years. Bone density scan. This is done to screen for osteoporosis. You may have this done starting at age 4. Mammogram. This may be done every 1-2 years. Talk to your health care provider about how often you should have regular mammograms. Talk with your health care provider about your test results, treatment options, and if necessary, the need for more tests. Vaccines  Your health care provider may recommend certain vaccines, such as: Influenza vaccine. This is recommended every year. Tetanus, diphtheria, and acellular pertussis (Tdap, Td) vaccine. You may need a Td booster every 10 years. Zoster vaccine. You may need this after age 35. Pneumococcal 13-valent conjugate (PCV13) vaccine. One dose is recommended after age 48. Pneumococcal polysaccharide (PPSV23) vaccine. One dose is recommended after age 31. Talk to your health care provider about which screenings and vaccines you need and how often you need them. This information is  not intended to replace advice given to you by  your health care provider. Make sure you discuss any questions you have with your health care provider. Document Released: 02/19/2015 Document Revised: 10/13/2015 Document Reviewed: 11/24/2014 Elsevier Interactive Patient Education  2017 [employer].  Fall Prevention in the Home Falls can cause injuries. They can happen to people of all ages. There are many things you can do to make your home safe and to help prevent falls. What can I do on the outside of my home? Regularly fix the edges of walkways and driveways and fix any cracks. Remove anything that might make you trip as you walk through a door, such as a raised step or threshold. Trim any bushes or trees on the path to your home. Use bright outdoor lighting. Clear any walking paths of anything that might make someone trip, such as rocks or tools. Regularly check to see if handrails are loose or broken. Make sure that both sides of any steps have handrails. Any raised decks and porches should have guardrails on the edges. Have any leaves, snow, or ice cleared regularly. Use sand or salt on walking paths during winter. Clean up any spills in your garage right away. This includes oil or grease spills. What can I do in the bathroom? Use night lights. Install grab bars by the toilet and in the tub and shower. Do not use towel bars as grab bars. Use non-skid mats or decals in the tub or shower. If you need to sit down in the shower, use a plastic, non-slip stool. Keep the floor dry. Clean up any water that spills on the floor as soon as it happens. Remove soap buildup in the tub or shower regularly. Attach bath mats securely with double-sided non-slip rug tape. Do not have throw rugs and other things on the floor that can make you trip. What can I do in the bedroom? Use night lights. Make sure that you have a light by your bed that is easy to reach. Do not use any sheets or blankets that are too big for your bed. They should not hang  down onto the floor. Have a firm chair that has side arms. You can use this for support while you get dressed. Do not have throw rugs and other things on the floor that can make you trip. What can I do in the kitchen? Clean up any spills right away. Avoid walking on wet floors. Keep items that you use a lot in easy-to-reach places. If you need to reach something above you, use a strong step stool that has a grab bar. Keep electrical cords out of the way. Do not use floor polish or wax that makes floors slippery. If you must use wax, use non-skid floor wax. Do not have throw rugs and other things on the floor that can make you trip. What can I do with my stairs? Do not leave any items on the stairs. Make sure that there are handrails on both sides of the stairs and use them. Fix handrails that are broken or loose. Make sure that handrails are as long as the stairways. Check any carpeting to make sure that it is firmly attached to the stairs. Fix any carpet that is loose or worn. Avoid having throw rugs at the top or bottom of the stairs. If you do have throw rugs, attach them to the floor with carpet tape. Make sure that you have a light switch at the top of the  stairs and the bottom of the stairs. If you do not have them, ask someone to add them for you. What else can I do to help prevent falls? Wear shoes that: Do not have high heels. Have rubber bottoms. Are comfortable and fit you well. Are closed at the toe. Do not wear sandals. If you use a stepladder: Make sure that it is fully opened. Do not climb a closed stepladder. Make sure that both sides of the stepladder are locked into place. Ask someone to hold it for you, if possible. Clearly mark and make sure that you can see: Any grab bars or handrails. First and last steps. Where the edge of each step is. Use tools that help you move around (mobility aids) if they are needed. These  include: Canes. Walkers. Scooters. Crutches. Turn on the lights when you go into a dark area. Replace any light bulbs as soon as they burn out. Set up your furniture so you have a clear path. Avoid moving your furniture around. If any of your floors are uneven, fix them. If there are any pets around you, be aware of where they are. Review your medicines with your doctor. Some medicines can make you feel dizzy. This can increase your chance of falling. Ask your doctor what other things that you can do to help prevent falls. This information is not intended to replace advice given to you by your health care provider. Make sure you discuss any questions you have with your health care provider. Document Released: 11/19/2008 Document Revised: 07/01/2015 Document Reviewed: 02/27/2014 Elsevier Interactive Patient Education  2017 [employer].

## 2022-07-13 NOTE — Progress Notes (Signed)
Subjective:   Norma Murray is a 72 y.o. female who presents for an Initial Medicare Annual Wellness Visit.  I connected with  Norma Murray on 07/13/22 by a telephone enabled telemedicine application and verified that I am speaking with the correct person using two identifiers.   I discussed the limitations of evaluation and management by telemedicine. The patient expressed understanding and agreed to proceed.  Patient location: home  Provider location: telephone/home    Review of Systems     Cardiac Risk Factors include: advanced age (>60men, >63 women);family history of premature cardiovascular disease     Objective:    Today's Vitals   There is no height or weight on file to calculate BMI.     07/13/2022    8:35 AM 09/01/2021    8:21 AM 06/02/2021    8:23 AM 12/06/2020   11:17 AM 05/31/2020    8:40 AM  Advanced Directives  Does Patient Have a Medical Advance Directive? No No No No No  Would patient like information on creating a medical advance directive? No - Patient declined No - Patient declined No - Patient declined No - Patient declined No - Patient declined    Current Medications (verified) Outpatient Encounter Medications as of 07/13/2022  Medication Sig   anastrozole (ARIMIDEX) 1 MG tablet TAKE 1 TABLET BY MOUTH EVERY DAY   cyclobenzaprine (FLEXERIL) 10 MG tablet Take 1 tablet (10 mg total) by mouth 3 (three) times daily as needed.   cyclobenzaprine (FLEXERIL) 10 MG tablet TAKE 1 TABLET BY MOUTH THREE TIMES A DAY AS NEEDED   gabapentin (NEURONTIN) 100 MG capsule Take 1 capsule (100 mg total) by mouth 2 (two) times daily.   levothyroxine (SYNTHROID) 50 MCG tablet Take 1 tablet (50 mcg total) by mouth daily before breakfast.   Turmeric (QC TUMERIC COMPLEX) 500 MG CAPS Take 1 tablet by mouth daily.   VERZENIO 100 MG tablet TAKE 1 TABLET (100 MG TOTAL) BY MOUTH 2 (TWO) TIMES DAILY. SWALLOW TABLETS WHOLE. DO NOT CHEW, CRUSH, OR SPLIT TABLETS BEFORE SWALLOWING.   No  facility-administered encounter medications on file as of 07/13/2022.    Allergies (verified) Aspirin   History: Past Medical History:  Diagnosis Date   Anxiety    Breast cancer (HCC)    Depression    Family history of bone cancer    Family history of breast cancer    Family history of heart disease    Hypothyroidism    Neuropathy    Past Surgical History:  Procedure Laterality Date   APPENDECTOMY     OVARY SURGERY     Removed   Family History  Problem Relation Age of Onset   Hypertension Mother    Thyroid disease Mother    Heart disease Father    Thyroid disease Sister    Thyroid disease Sister    Breast cancer Maternal Aunt 30   Breast cancer Maternal Aunt 40   Mesothelioma Maternal Uncle        worked on railroad   Lung cancer Maternal Uncle 83       smoker   Cirrhosis Maternal Grandfather        liver   Social History   Socioeconomic History   Marital status: Married    Spouse name: Not on file   Number of children: 1   Years of education: High School   Highest education level: 12th grade  Occupational History   Not on file  Tobacco Use   Smoking status:  Never   Smokeless tobacco: Never  Vaping Use   Vaping Use: Never used  Substance and Sexual Activity   Alcohol use: Yes    Alcohol/week: 1.0 standard drink of alcohol    Types: 1 Glasses of wine per week    Comment: 1 glass of wine daily   Drug use: Not Currently   Sexual activity: Not Currently  Other Topics Concern   Not on file  Social History Narrative   Not on file   Social Determinants of Health   Financial Resource Strain: Low Risk  (07/13/2022)   Overall Financial Resource Strain (CARDIA)    Difficulty of Paying Living Expenses: Not hard at all  Food Insecurity: No Food Insecurity (07/13/2022)   Hunger Vital Sign    Worried About Running Out of Food in the Last Year: Never true    Ran Out of Food in the Last Year: Never true  Transportation Needs: No Transportation Needs (07/13/2022)    PRAPARE - Administrator, Civil Service (Medical): No    Lack of Transportation (Non-Medical): No  Physical Activity: Inactive (07/13/2022)   Exercise Vital Sign    Days of Exercise per Week: 0 days    Minutes of Exercise per Session: 0 min  Stress: Stress Concern Present (07/13/2022)   [employer] of Occupational Health - Occupational Stress Questionnaire    Feeling of Stress : Rather much  Social Connections: Moderately Isolated (07/13/2022)   Social Connection and Isolation Panel [NHANES]    Frequency of Communication with Friends and Family: More than three times a week    Frequency of Social Gatherings with Friends and Family: Twice a week    Attends Religious Services: Never    Database administrator or Organizations: No    Attends Engineer, structural: Never    Marital Status: Married    Tobacco Counseling Counseling given: Not Answered   Clinical Intake:  Pre-visit preparation completed: Yes  Pain : No/denies pain     Diabetes: No  How often do you need to have someone help you when you read instructions, pamphlets, or other written materials from your doctor or pharmacy?: 1 - Never  Diabetic?  no  Interpreter Needed?: No  Information entered by :: Remi Haggard LPN   Activities of Daily Living    07/13/2022    8:37 AM 07/12/2022    5:43 AM  In your present state of health, do you have any difficulty performing the following activities:  Hearing? 0 0  Vision? 0 0  Difficulty concentrating or making decisions? 0 0  Walking or climbing stairs? 0 0  Dressing or bathing? 0 0  Doing errands, shopping? 0 0  Preparing Food and eating ? N N  Using the Toilet? N N  In the past six months, have you accidently leaked urine? N N  Do you have problems with loss of bowel control? N N  Managing your Medications? N N  Managing your Finances? N N  Housekeeping or managing your Housekeeping? N N    Patient Care Team: Alveria Apley, NP as PCP  - General (Family Medicine) Serena Croissant, MD as Consulting Physician (Hematology and Oncology) [facility], [facility]  Indicate any recent Medical Services you may have received from other than [facility] providers in the past year (date may be approximate).     Assessment:   This is a routine wellness examination for Norma Murray.  Hearing/Vision screen Hearing Screening - Comments:: Not trouble hearing  Vision Screening - Comments:: Up to date [facility]  Dietary issues and exercise activities discussed: Current Exercise Habits: The patient does not participate in regular exercise at present   Goals Addressed             This Visit's Progress    Patient Stated       Continue current lifestyle       Depression Screen    07/13/2022    8:40 AM 06/14/2022    8:29 AM  PHQ 2/9 Scores  PHQ - 2 Score 1 2  PHQ- 9 Score 6 4    Fall Risk    07/13/2022    8:36 AM 07/12/2022    5:43 AM 06/15/2022   10:18 AM 06/14/2022    8:14 AM  Fall Risk   Falls in the past year? 0 0 0 0  Number falls in past yr: 0  0 0  Injury with Fall? 0 0 0 0  Risk for fall due to :   No Fall Risks No Fall Risks  Follow up Falls evaluation completed;Education provided;Falls prevention discussed  Falls evaluation completed Falls evaluation completed    FALL RISK PREVENTION PERTAINING TO THE HOME:  Any stairs in or around the home? Yes  If so, are there any without handrails? No  Home free of loose throw rugs in walkways, pet beds, electrical cords, etc? Yes  Adequate lighting in your home to reduce risk of falls? Yes   ASSISTIVE DEVICES UTILIZED TO PREVENT FALLS:  Life alert? No  Use of a cane, walker or w/c? No  Grab bars in the bathroom? No  Shower chair or bench in shower? Yes  Elevated toilet seat or a handicapped toilet? No   TIMED UP AND GO:  Was the test performed? No .    Cognitive Function:        07/13/2022    8:37 AM  6CIT Screen  What Year? 0 points  What month? 0  points  What time? 0 points  Count back from 20 0 points  Months in reverse 0 points  Repeat phrase 0 points  Total Score 0 points    Immunizations Immunization History  Administered Date(s) Administered   PFIZER(Purple Top)SARS-COV-2 Vaccination 10/18/2019, 12/08/2019    TDAP status: Due, Education has been provided regarding the importance of this vaccine. Advised may receive this vaccine at local pharmacy or Health Dept. Aware to provide a copy of the vaccination record if obtained from local pharmacy or Health Dept. Verbalized acceptance and understanding.  Flu Vaccine status: Declined, Education has been provided regarding the importance of this vaccine but patient still declined. Advised may receive this vaccine at local pharmacy or Health Dept. Aware to provide a copy of the vaccination record if obtained from local pharmacy or Health Dept. Verbalized acceptance and understanding.  Pneumococcal vaccine status: Declined,  Education has been provided regarding the importance of this vaccine but patient still declined. Advised may receive this vaccine at local pharmacy or Health Dept. Aware to provide a copy of the vaccination record if obtained from local pharmacy or Health Dept. Verbalized acceptance and understanding.   Covid-19 vaccine status: Declined, Education has been provided regarding the importance of this vaccine but patient still declined. Advised may receive this vaccine at local pharmacy or Health Dept.or vaccine clinic. Aware to provide a copy of the vaccination record if obtained from local pharmacy or Health Dept. Verbalized acceptance and understanding.  Qualifies for Shingles Vaccine? Yes  Zostavax completed No   Shingrix Completed?: No.    Education has been provided regarding the importance of this vaccine. Patient has been advised to call insurance company to determine out of pocket expense if they have not yet received this vaccine. Advised may also receive  vaccine at local pharmacy or Health Dept. Verbalized acceptance and understanding.  Screening Tests Health Maintenance  Topic Date Due   Hepatitis C Screening  Never done   DTaP/Tdap/Td (1 - Tdap) Never done   DEXA SCAN  Never done   COVID-19 Vaccine (3 - Pfizer risk series) 07/29/2022 (Originally 01/05/2020)   Zoster Vaccines- Shingrix (1 of 2) 10/13/2022 (Originally [birth year]1970)   Pneumonia Vaccine 67+ Years old (1 of 1 - PCV) 07/13/2023 (Originally 08/06/2016)   MAMMOGRAM  07/13/2023 (Originally 08/06/2001)   Colonoscopy  07/13/2023 (Originally 08/06/1996)   INFLUENZA VACCINE  09/07/2022   Medicare Annual Wellness (AWV)  07/13/2023   HPV VACCINES  Aged Out    Health Maintenance  Health Maintenance Due  Topic Date Due   Hepatitis C Screening  Never done   DTaP/Tdap/Td (1 - Tdap) Never done   DEXA SCAN  Never done    Colonoscopy  declined  Mammogram  declined  Bone Density status: Ordered  . Pt provided with contact info and advised to call to schedule appt.  Lung Cancer Screening: (Low Dose CT Chest recommended if Age 6-80 years, 30 pack-year currently smoking OR have quit w/in 15years.) does not qualify.   Lung Cancer Screening Referral:   Additional Screening:  Hepatitis C Screening   Vision Screening: Recommended annual ophthalmology exams for early detection of glaucoma and other disorders of the eye. Is the patient up to date with their annual eye exam?  Yes  Who is the provider or what is the name of the office in which the patient attends annual eye exams? [facility] If pt is not established with a provider, would they like to be referred to a provider to establish care? No .   Dental Screening: Recommended annual dental exams for proper oral hygiene  Community Resource Referral / Chronic Care Management: CRR required this visit?  No   CCM required this visit?  No      Plan:     I have personally reviewed and noted the following in the patient's  chart:   Medical and social history Use of alcohol, tobacco or illicit drugs  Current medications and supplements including opioid prescriptions. Patient is not currently taking opioid prescriptions. Functional ability and status Nutritional status Physical activity Advanced directives List of other physicians Hospitalizations, surgeries, and ER visits in previous 12 months Vitals Screenings to include cognitive, depression, and falls Referrals and appointments  In addition, I have reviewed and discussed with patient certain preventive protocols, quality metrics, and best practice recommendations. A written personalized care plan for preventive services as well as general preventive health recommendations were provided to patient.     Remi Haggard, LPN   02/11/1094   Nurse Notes: patient has not started her Gabapentin yet.  Cancelled her up coming appointment.  Will call to reschedule once she starts medication.

## 2022-07-17 ENCOUNTER — Ambulatory Visit: Payer: Medicare PPO | Admitting: Family Medicine

## 2022-07-27 ENCOUNTER — Telehealth: Payer: Self-pay | Admitting: *Deleted

## 2022-07-27 NOTE — Telephone Encounter (Signed)
Received call from pt with complaint of left arm/ shoulder pain x 3 weeks. Pt denies redness warmth or swelling, chest pain, recent injury or trauma.  Pt states pain is worse at night.  Per MD pt needing [facility] visit for further evaluation and tx.  Appt scheduled, pt notified and verbalized understanding.

## 2022-07-28 ENCOUNTER — Inpatient Hospital Stay: Payer: Medicare PPO

## 2022-07-28 ENCOUNTER — Inpatient Hospital Stay: Payer: Medicare PPO | Attending: Hematology and Oncology | Admitting: Physician Assistant

## 2022-07-28 ENCOUNTER — Ambulatory Visit (HOSPITAL_COMMUNITY)
Admission: RE | Admit: 2022-07-28 | Discharge: 2022-07-28 | Disposition: A | Discharge status: Home / Self Care | Payer: Medicare PPO | Source: Ambulatory Visit | Attending: Physician Assistant | Admitting: Physician Assistant

## 2022-07-28 ENCOUNTER — Other Ambulatory Visit: Payer: Self-pay | Admitting: Physician Assistant

## 2022-07-28 VITALS — BP 141/88 | HR 85 | Temp 98.1°F | Resp 17 | Ht 65.0 in | Wt 141.5 lb

## 2022-07-28 DIAGNOSIS — M79602 Pain in left arm: Secondary | ICD-10-CM | POA: Insufficient documentation

## 2022-07-28 DIAGNOSIS — M19012 Primary osteoarthritis, left shoulder: Secondary | ICD-10-CM | POA: Insufficient documentation

## 2022-07-28 DIAGNOSIS — Z79811 Long term (current) use of aromatase inhibitors: Secondary | ICD-10-CM | POA: Diagnosis not present

## 2022-07-28 DIAGNOSIS — M542 Cervicalgia: Secondary | ICD-10-CM | POA: Insufficient documentation

## 2022-07-28 DIAGNOSIS — C7951 Secondary malignant neoplasm of bone: Secondary | ICD-10-CM | POA: Diagnosis not present

## 2022-07-28 DIAGNOSIS — M5410 Radiculopathy, site unspecified: Secondary | ICD-10-CM

## 2022-07-28 DIAGNOSIS — Z170 Estrogen receptor positive status [ER+]: Secondary | ICD-10-CM | POA: Insufficient documentation

## 2022-07-28 DIAGNOSIS — C50919 Malignant neoplasm of unspecified site of unspecified female breast: Secondary | ICD-10-CM | POA: Diagnosis not present

## 2022-07-28 DIAGNOSIS — M19022 Primary osteoarthritis, left elbow: Secondary | ICD-10-CM | POA: Diagnosis not present

## 2022-07-28 DIAGNOSIS — C50911 Malignant neoplasm of unspecified site of right female breast: Secondary | ICD-10-CM | POA: Insufficient documentation

## 2022-07-28 DIAGNOSIS — M25512 Pain in left shoulder: Secondary | ICD-10-CM | POA: Diagnosis not present

## 2022-07-28 MED ORDER — KETOROLAC TROMETHAMINE 30 MG/ML IJ SOLN
30.0000 mg | Freq: Once | INTRAMUSCULAR | Status: AC
  Administered 2022-07-28: 30 mg via INTRAMUSCULAR
  Filled 2022-07-28: qty 1

## 2022-07-28 MED ORDER — PREDNISONE 20 MG PO TABS: 20.0000 mg | ORAL_TABLET | Freq: Two times a day (BID) | ORAL | 0 refills | Status: AC

## 2022-07-28 MED ORDER — CYCLOBENZAPRINE HCL 10 MG PO TABS: 10.0000 mg | ORAL_TABLET | Freq: Three times a day (TID) | ORAL | 0 refills | Status: DC | PRN

## 2022-07-28 NOTE — Progress Notes (Signed)
Symptom Management Consult Note [facility]    Patient Care Team: Alveria Apley, NP as PCP - General (Family Medicine) Serena Croissant, MD as Consulting Physician (Hematology and Oncology) [place], [facility]    Name / MRN / DOB: Norma Murray  [id number]  1950/02/22   Date of visit: 07/28/2022   Chief Complaint/Reason for visit: left arm pain   Current Therapy: Abemaciclib, anastrozole and Zometa   Last treatment:  Zometa on 05/19/22   ASSESSMENT & PLAN: Patient is a 72 y.o. female  with oncologic history of primary malignant neoplasm of breast with metastasis followed by Dr. Pamelia Hoit.  I have viewed most recent oncology note and lab work.    #Primary malignant neoplasm of breast with metastasis  - Next appointment with oncologist is 08/18/22   #Left arm pain -Exam without deformity, has strong radial pulse. Equal grip strength. -Shoulder has limited range of motion secondary to pain. -Xray of the left shoulder and elbow show degenerativa changes. No lytic or sclerotic lesions.  I viewed imaging and agree with radiologist impression. -HPI is suggestive of cervical radiculopathy.. -Patient received 30 mg IM Toradol in clinic for pain.  Chart review with close to normal kidney function. -Prescription sent to the pharmacy for Flexeril.  Discussed Medrol Dosepak to help with symptoms as well however patient wants to avoid steroids if possible.  She does agree to a short course of steroids if needed.  I sent prescription for prednisone 20 mg twice daily x 3 days. -Discussed symptomatic care and encouraged follow up with orthopedics.    Strict ED precautions discussed should symptoms worsen.    Heme/Onc History: Oncology History  Primary malignant neoplasm of breast with metastasis (HCC)  09/25/2018 Initial Diagnosis   Right breast biopsy on 09/25/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    05/22/2019  PET scan   PET scan on 05/22/19 showed resolution of the multiple osseous metastatic lesions, right chest wall mass, and bilateral axillary lymphadenopathy.     Anti-estrogen oral therapy   Abemaciclib, Anastrozole and Zometa.  (Moved from [place])   09/10/2019 Genetic Testing   Negative genetic testing:  No pathogenic variants detected on the [facility] Common Hereditary Cancers panel. The report date is 09/10/2019.   The Common Hereditary Cancers Panel offered by [facility] includes sequencing and/or deletion duplication testing of the following 48 genes: APC, ATM, AXIN2, BARD1, BMPR1A, BRCA1, BRCA2, BRIP1, CDH1, CDK4, CDKN2A (p14ARF), CDKN2A (p16INK4a), CHEK2, CTNNA1, DICER1, EPCAM (Deletion/duplication testing only), GREM1 (promoter region deletion/duplication testing only), KIT, MEN1, MLH1, MSH2, MSH3, MSH6, MUTYH, NBN, NF1, NTHL1, PALB2, PDGFRA, PMS2, POLD1, POLE, PTEN, RAD50, RAD51C, RAD51D, RNF43, SDHB, SDHC, SDHD, SMAD4, SMARCA4. STK11, TP53, TSC1, TSC2, and VHL.  The following genes were evaluated for sequence changes only: SDHA and HOXB13 c.251G>A variant only.        Interval history-: Norma Murray is a 72 y.o. female with oncologic history as above presenting to [facility] today with chief complaint of left arm pain x 1 month.  Patient presents to clinic unaccompanied.  Patient is right-hand dominant.  Patient denies any injury preceding her pain.  She reports at first she had some pain in her left shoulder near her neck.  The pain began to radiate down her arm. She is active working in her garden frequently so at first thought this could be pulled muscle. She describes it as aching and sharp.  She has tried Tylenol, over-the-counter lidocaine patches and  Aspercreme which initially seemed to be helping however now they are not.  She rates the pain currently 7 out of 10 in severity.  She denies any numbness or tingling.  No pain in her back.  She feels as if her left arm is weak because she is moving it  less secondary to pain.    ROS  All other systems are reviewed and are negative for acute change except as noted in the HPI.    Allergies  Allergen Reactions   Aspirin     Free Bleed     Past Medical History:  Diagnosis Date   Anxiety    Breast cancer (HCC)    Depression    Family history of bone cancer    Family history of breast cancer    Family history of heart disease    Hypothyroidism    Neuropathy      Past Surgical History:  Procedure Laterality Date   APPENDECTOMY     OVARY SURGERY     Removed    Social History   Socioeconomic History   Marital status: Married    Spouse name: Not on file   Number of children: 1   Years of education: High School   Highest education level: 12th grade  Occupational History   Not on file  Tobacco Use   Smoking status: Never   Smokeless tobacco: Never  Vaping Use   Vaping Use: Never used  Substance and Sexual Activity   Alcohol use: Yes    Alcohol/week: 1.0 standard drink of alcohol    Types: 1 Glasses of wine per week    Comment: 1 glass of wine daily   Drug use: Not Currently   Sexual activity: Not Currently  Other Topics Concern   Not on file  Social History Narrative   Not on file   Social Determinants of Health   Financial Resource Strain: Low Risk  (07/13/2022)   Overall Financial Resource Strain (CARDIA)    Difficulty of Paying Living Expenses: Not hard at all  Food Insecurity: No Food Insecurity (07/13/2022)   Hunger Vital Sign    Worried About Running Out of Food in the Last Year: Never true    Ran Out of Food in the Last Year: Never true  Transportation Needs: No Transportation Needs (07/13/2022)   PRAPARE - Administrator, Civil Service (Medical): No    Lack of Transportation (Non-Medical): No  Physical Activity: Inactive (07/13/2022)   Exercise Vital Sign    Days of Exercise per Week: 0 days    Minutes of Exercise per Session: 0 min  Stress: Stress Concern Present (07/13/2022)   [employer] of Occupational Health - Occupational Stress Questionnaire    Feeling of Stress : Rather much  Social Connections: Moderately Isolated (07/13/2022)   Social Connection and Isolation Panel [NHANES]    Frequency of Communication with Friends and Family: More than three times a week    Frequency of Social Gatherings with Friends and Family: Twice a week    Attends Religious Services: Never    Database administrator or Organizations: No    Attends Banker Meetings: Never    Marital Status: Married  Catering manager Violence: Not At Risk (07/13/2022)   Humiliation, Afraid, Rape, and Kick questionnaire    Fear of Current or Ex-Partner: No    Emotionally Abused: No    Physically Abused: No    Sexually Abused: No    Family History  Problem Relation Age of Onset   Hypertension Mother    Thyroid disease Mother    Heart disease Father    Thyroid disease Sister    Thyroid disease Sister    Breast cancer Maternal Aunt 30   Breast cancer Maternal Aunt 40   Mesothelioma Maternal Uncle        worked on railroad   Lung cancer Maternal Uncle 83       smoker   Cirrhosis Maternal Grandfather        liver     Current Outpatient Medications:    cyclobenzaprine (FLEXERIL) 10 MG tablet, Take 1 tablet (10 mg total) by mouth 3 (three) times daily as needed for muscle spasms., Disp: 30 tablet, Rfl: 0   predniSONE (DELTASONE) 20 MG tablet, Take 1 tablet (20 mg total) by mouth 2 (two) times daily with a meal for 3 days., Disp: 6 tablet, Rfl: 0   anastrozole (ARIMIDEX) 1 MG tablet, TAKE 1 TABLET BY MOUTH EVERY DAY, Disp: 90 tablet, Rfl: 3   cyclobenzaprine (FLEXERIL) 10 MG tablet, TAKE 1 TABLET BY MOUTH THREE TIMES A DAY AS NEEDED, Disp: 20 tablet, Rfl: 0   gabapentin (NEURONTIN) 100 MG capsule, Take 1 capsule (100 mg total) by mouth 2 (two) times daily., Disp: 60 capsule, Rfl: 0   levothyroxine (SYNTHROID) 50 MCG tablet, Take 1 tablet (50 mcg total) by mouth daily before breakfast.,  Disp: 90 tablet, Rfl: 0   Turmeric (QC TUMERIC COMPLEX) 500 MG CAPS, Take 1 tablet by mouth daily., Disp: , Rfl:    VERZENIO 100 MG tablet, TAKE 1 TABLET (100 MG TOTAL) BY MOUTH 2 (TWO) TIMES DAILY. SWALLOW TABLETS WHOLE. DO NOT CHEW, CRUSH, OR SPLIT TABLETS BEFORE SWALLOWING., Disp: 56 tablet, Rfl: 0  PHYSICAL EXAM: ECOG FS:1 - Symptomatic but completely ambulatory    Vitals:   07/28/22 0857  BP: (!) 141/88  Pulse: 85  Resp: 17  Temp: 98.1 F (36.7 C)  TempSrc: Oral  SpO2: 100%  Weight: 141 lb 8 oz (64.2 kg)  Height: 5\' 5"  (1.651 m)   Physical Exam Vitals and nursing note reviewed.  Constitutional:      Appearance: She is not ill-appearing or toxic-appearing.  HENT:     Head: Normocephalic.  Eyes:     Conjunctiva/sclera: Conjunctivae normal.  Neck:     Comments: Tenderness to palpation of right trapezius. No mass appreciated.  No midline tenderness to cervical spine.  No bony step-offs. Cardiovascular:     Rate and Rhythm: Normal rate and regular rhythm.     Pulses: Normal pulses.          Radial pulses are 2+ on the right side and 2+ on the left side.     Heart sounds: Normal heart sounds.  Pulmonary:     Effort: Pulmonary effort is normal.     Breath sounds: Normal breath sounds.  Abdominal:     General: There is no distension.  Musculoskeletal:     Cervical back: Normal range of motion.     Comments: Decreased ROM of left shoulder secondary to pain.  No obvious deformity or bony tenderness of left upper extremity.  No overlying skin changes. Compartments soft in left upper extremity.  No tenderness to palpation of thoracic or lumbar spine  Ambulatory with normal gait  Skin:    General: Skin is warm and dry.     Capillary Refill: Capillary refill takes less than 2 seconds.  Neurological:     Mental Status: She  is alert.     Comments: Strong equal grip strength in bilateral upper extremities.  Sensation intact.        LABORATORY DATA: I have reviewed  the data as listed    Latest Ref Rng & Units 05/19/2022    7:51 AM 03/07/2022    7:47 AM 12/05/2021    7:47 AM  CBC  WBC 4.0 - 10.5 K/uL 3.6  3.7  4.0   Hemoglobin 12.0 - 15.0 g/dL 62.1  30.8  65.7   Hematocrit 36.0 - 46.0 % 40.4  40.1  38.3   Platelets 150 - 400 K/uL 144  134  143         Latest Ref Rng & Units 05/19/2022    7:51 AM 03/07/2022    7:47 AM 12/05/2021    7:47 AM  CMP  Glucose 70 - 99 mg/dL 93  92  94   BUN 8 - 23 mg/dL 16  17  18    Creatinine 0.44 - 1.00 mg/dL 8.46  9.62  9.52   Sodium 135 - 145 mmol/L 140  140  138   Potassium 3.5 - 5.1 mmol/L 4.1  3.7  4.1   Chloride 98 - 111 mmol/L 109  110  108   CO2 22 - 32 mmol/L 25  25  25    Calcium 8.9 - 10.3 mg/dL 84.1  9.5  9.0   Total Protein 6.5 - 8.1 g/dL 6.7  6.9  6.6   Total Bilirubin 0.3 - 1.2 mg/dL 0.5  0.8  0.6   Alkaline Phos 38 - 126 U/L 79  88  56   AST 15 - 41 U/L 34  25  24   ALT 0 - 44 U/L 44  31  24        RADIOGRAPHIC STUDIES (from last 24 hours if applicable) I have personally reviewed the radiological images as listed and agreed with the findings in the report. DG Shoulder Left  Result Date: 07/28/2022 CLINICAL DATA:  Shoulder pain. EXAM: LEFT SHOULDER - 2+ VIEW COMPARISON:  None Available. FINDINGS: Two-view exam shows no evidence for shoulder separation. No acute fracture. Mild degenerative changes are seen at the acromioclavicular joint. No worrisome lytic or sclerotic osseous abnormality. IMPRESSION: Mild degenerative changes at the acromioclavicular joint. No acute bony findings. Electronically Signed   By: Kennith Center M.D.   On: 07/28/2022 11:12   DG Elbow 2 Views Left  Result Date: 07/28/2022 CLINICAL DATA:  Pain EXAM: LEFT ELBOW - 2 VIEW COMPARISON:  None Available. FINDINGS: There is no acute fracture or dislocation. Bony alignment is normal. There is mild degenerative change about the elbow. The soft tissues are unremarkable. There is no effusion. IMPRESSION: Mild degenerative changes  about the elbow.  No acute finding. Electronically Signed   By: Lesia Hausen M.D.   On: 07/28/2022 10:59        Visit Diagnosis: 1. Radiculopathy affecting upper extremity   2. Primary malignant neoplasm of breast with metastasis (HCC)      Orders Placed This Encounter  Procedures   DG Elbow 2 Views Left    Standing Status:   Future    Number of Occurrences:   1    Standing Expiration Date:   07/28/2023    Order Specific Question:   Reason for Exam (SYMPTOM  OR DIAGNOSIS REQUIRED)    Answer:   pain, history breast cancer    Order Specific Question:   Preferred imaging location?    Answer:  [facility]    All questions were answered. The patient knows to call the clinic with any problems, questions or concerns. No barriers to learning was detected.  A total of more than 30 minutes were spent on this encounter with face-to-face time and non-face-to-face time, including preparing to see the patient, ordering tests and/or medications, counseling the patient and coordination of care as outlined above.    Thank you for allowing me to participate in the care of this patient.    Shanon Ace, PA-C Department of Hematology/Oncology [facility] at [facility] Phone: [phone]  [phone]    07/28/2022 12:36 PM

## 2022-07-28 NOTE — Patient Instructions (Addendum)
Xray of your shoulder shows mild degenerative changes at the acromioclavicular joint.  Xray of your elbow shows mild degenerative changes about the elbow.

## 2022-08-04 ENCOUNTER — Ambulatory Visit: Payer: Medicare PPO | Admitting: Nurse Practitioner

## 2022-08-17 ENCOUNTER — Other Ambulatory Visit: Payer: Self-pay | Admitting: Hematology and Oncology

## 2022-08-17 NOTE — Progress Notes (Signed)
Patient Care Team: Alveria Apley, NP as PCP - General (Family Medicine) Serena Croissant, MD as Consulting Physician (Hematology and Oncology) [facility], [facility]  DIAGNOSIS:  Encounter Diagnosis  Name Primary?   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    05/22/2019 PET scan   PET scan on 05/22/19 showed resolution of the multiple osseous metastatic lesions, right chest wall mass, and bilateral axillary lymphadenopathy.     Anti-estrogen oral therapy   Abemaciclib, Anastrozole and Zometa.  (Moved from [place])   09/10/2019 Genetic Testing   Negative genetic testing:  No pathogenic variants detected on the [facility] Common Hereditary Cancers panel. The report date is 09/10/2019.   The Common Hereditary Cancers Panel offered by [facility] includes sequencing and/or deletion duplication testing of the following 48 genes: APC, ATM, AXIN2, BARD1, BMPR1A, BRCA1, BRCA2, BRIP1, CDH1, CDK4, CDKN2A (p14ARF), CDKN2A (p16INK4a), CHEK2, CTNNA1, DICER1, EPCAM (Deletion/duplication testing only), GREM1 (promoter region deletion/duplication testing only), KIT, MEN1, MLH1, MSH2, MSH3, MSH6, MUTYH, NBN, NF1, NTHL1, PALB2, PDGFRA, PMS2, POLD1, POLE, PTEN, RAD50, RAD51C, RAD51D, RNF43, SDHB, SDHC, SDHD, SMAD4, SMARCA4. STK11, TP53, TSC1, TSC2, and VHL.  The following genes were evaluated for sequence changes only: SDHA and HOXB13 c.251G>A variant only.      CHIEF COMPLIANT: Follow-up of metastatic breast cancer Zometa  and anastrozole   INTERVAL HISTORY: Norma Murray is a 72 y.o. with above-mentioned history of metastatic breast cancer currently on treatment with Abemaciclib, anastrozole, and Zometa. She presents to the clinic  today for follow-up. Pt reports that she is doing well. She still complains of achy and pain in the left arm. Lying on it is the only thing that makes it worst. On a scale of 1-10 pain level is 6 or 7. Constant pain. Tolerating Verzinio fairly well.   ALLERGIES:  is allergic to aspirin.  MEDICATIONS:  Current Outpatient Medications  Medication Sig Dispense Refill   anastrozole (ARIMIDEX) 1 MG tablet TAKE 1 TABLET BY MOUTH EVERY DAY 90 tablet 3   cyclobenzaprine (FLEXERIL) 10 MG tablet TAKE 1 TABLET BY MOUTH THREE TIMES A DAY AS NEEDED 20 tablet 0   levothyroxine (SYNTHROID) 50 MCG tablet Take 1 tablet (50 mcg total) by mouth daily before breakfast. 90 tablet 0   Turmeric (QC TUMERIC COMPLEX) 500 MG CAPS Take 1 tablet by mouth daily.     VERZENIO 100 MG tablet TAKE 1 TABLET (100 MG TOTAL) BY MOUTH 2 (TWO) TIMES DAILY. SWALLOW TABLETS WHOLE. DO NOT CHEW, CRUSH, OR SPLIT TABLETS BEFORE SWALLOWING. 56 tablet 0   cyclobenzaprine (FLEXERIL) 10 MG tablet Take 1 tablet (10 mg total) by mouth 3 (three) times daily as needed for muscle spasms. 30 tablet 0   gabapentin (NEURONTIN) 100 MG capsule Take 1 capsule (100 mg total) by mouth 2 (two) times daily. 60 capsule 0   No current facility-administered medications for this visit.    PHYSICAL EXAMINATION: ECOG PERFORMANCE STATUS: 1 - Symptomatic but completely ambulatory  Vitals:   08/18/22 0807  BP: 139/87  Pulse: 85  Resp: 14  Temp: (!) 97.5 F (36.4 C)  SpO2: 99%   Filed Weights   08/18/22 0807  Weight: 144 lb 3.2 oz (65.4 kg)      LABORATORY DATA:  I have reviewed  the data as listed    Latest Ref Rng & Units 08/18/2022    7:44 AM 05/19/2022    7:51 AM 03/07/2022    7:47 AM  CMP  Glucose 70 - 99 mg/dL 79  93  92   BUN 8 - 23 mg/dL 15  16  17    Creatinine 0.44 - 1.00 mg/dL 1.61  0.96  0.45   Sodium 135 - 145 mmol/L 139  140  140   Potassium 3.5 - 5.1 mmol/L 4.8  4.1  3.7   Chloride 98 - 111 mmol/L 107  109  110   CO2 22 - 32  mmol/L 25  25  25    Calcium 8.9 - 10.3 mg/dL 9.2  40.9  9.5   Total Protein 6.5 - 8.1 g/dL 6.7  6.7  6.9   Total Bilirubin 0.3 - 1.2 mg/dL 0.6  0.5  0.8   Alkaline Phos 38 - 126 U/L 69  79  88   AST 15 - 41 U/L 24  34  25   ALT 0 - 44 U/L 29  44  31     Lab Results  Component Value Date   WBC 3.8 (L) 08/18/2022   HGB 14.3 08/18/2022   HCT 42.1 08/18/2022   MCV 107.7 (H) 08/18/2022   PLT 149 (L) 08/18/2022   NEUTROABS 2.2 08/18/2022    ASSESSMENT & PLAN:  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  Patient moved from [place] because she could not tolerate the heat.   Current treatment: Abemaciclib, anastrozole and Zometa    Toxicities: 1.  Elevated LFTs: Currently on 100 mg p.o. twice daily.  She is tolerating it extremely well with normal LFTs. 2. leukopenia/neutropenia: ANC 2.2 3.  Thrombocytopenia: Platelets 149 today   Bone metastases: On Zometa every 3 months Zometa toxicity: She developed chills aches and pains for 2 days after Zometa infusion.  Therefore we reduced the dosage of Zometa to 3 mg    Hypothyroidism: on thyroid medication. Depression:Starting her on Lexapro   Continue current treatment 11/28/2021: PET CT scan: No significant interval change in bone mets. 05/17/2022: PET CT scan: No evidence of progressive metastatic disease.  Similar bone metastases without hypermetabolism, hepatic steatosis   RTC in 3 months with labs and Zometa PET CT scan will be obtained in 3 months    Orders Placed This Encounter  Procedures   NM PET Image Restag (PS) Skull Base To Thigh    Standing Status:   Future    Standing Expiration Date:   08/18/2023    Order Specific Question:   If indicated for the ordered procedure, I authorize the administration of a radiopharmaceutical per Radiology protocol    Answer:   Yes    Order Specific Question:    Preferred imaging location?    Answer:   [facility]   The patient has a good understanding of the overall plan. she agrees with it. she will call with any problems that may develop before the next visit here. Total time spent: 30 mins including face to face time and time spent for planning, charting and co-ordination of care   Tamsen Meek, MD 08/18/22    I Janan Ridge am acting as a Neurosurgeon for [employer]  I have reviewed the above documentation for accuracy and completeness, and I agree with the above.

## 2022-08-18 ENCOUNTER — Other Ambulatory Visit: Payer: Self-pay

## 2022-08-18 ENCOUNTER — Inpatient Hospital Stay: Payer: Medicare PPO | Attending: Hematology and Oncology

## 2022-08-18 ENCOUNTER — Inpatient Hospital Stay: Payer: Medicare PPO

## 2022-08-18 ENCOUNTER — Inpatient Hospital Stay: Payer: Medicare PPO | Admitting: Hematology and Oncology

## 2022-08-18 VITALS — BP 139/87 | HR 85 | Temp 97.5°F | Resp 14 | Wt 144.2 lb

## 2022-08-18 DIAGNOSIS — C7951 Secondary malignant neoplasm of bone: Secondary | ICD-10-CM | POA: Insufficient documentation

## 2022-08-18 DIAGNOSIS — C50919 Malignant neoplasm of unspecified site of unspecified female breast: Secondary | ICD-10-CM

## 2022-08-18 DIAGNOSIS — M79602 Pain in left arm: Secondary | ICD-10-CM | POA: Diagnosis not present

## 2022-08-18 DIAGNOSIS — E039 Hypothyroidism, unspecified: Secondary | ICD-10-CM | POA: Insufficient documentation

## 2022-08-18 DIAGNOSIS — Z170 Estrogen receptor positive status [ER+]: Secondary | ICD-10-CM | POA: Diagnosis not present

## 2022-08-18 DIAGNOSIS — C50911 Malignant neoplasm of unspecified site of right female breast: Secondary | ICD-10-CM | POA: Insufficient documentation

## 2022-08-18 DIAGNOSIS — Z79811 Long term (current) use of aromatase inhibitors: Secondary | ICD-10-CM | POA: Diagnosis not present

## 2022-08-18 DIAGNOSIS — D6959 Other secondary thrombocytopenia: Secondary | ICD-10-CM | POA: Insufficient documentation

## 2022-08-18 DIAGNOSIS — R7989 Other specified abnormal findings of blood chemistry: Secondary | ICD-10-CM | POA: Insufficient documentation

## 2022-08-18 DIAGNOSIS — D701 Agranulocytosis secondary to cancer chemotherapy: Secondary | ICD-10-CM | POA: Insufficient documentation

## 2022-08-18 LAB — CMP (CANCER CENTER ONLY)
ALT: 29 U/L (ref 0–44)
AST: 24 U/L (ref 15–41)
Albumin: 4 g/dL (ref 3.5–5.0)
Alkaline Phosphatase: 69 U/L (ref 38–126)
Anion gap: 7 (ref 5–15)
BUN: 15 mg/dL (ref 8–23)
CO2: 25 mmol/L (ref 22–32)
Calcium: 9.2 mg/dL (ref 8.9–10.3)
Chloride: 107 mmol/L (ref 98–111)
Creatinine: 1.07 mg/dL — ABNORMAL HIGH (ref 0.44–1.00)
GFR, Estimated: 56 mL/min — ABNORMAL LOW (ref >60)
Glucose, Bld: 79 mg/dL (ref 70–99)
Potassium: 4.8 mmol/L (ref 3.5–5.1)
Sodium: 139 mmol/L (ref 135–145)
Total Bilirubin: 0.6 mg/dL (ref 0.3–1.2)
Total Protein: 6.7 g/dL (ref 6.5–8.1)

## 2022-08-18 LAB — CBC WITH DIFFERENTIAL (CANCER CENTER ONLY)
Abs Immature Granulocytes: 0.02 10*3/uL (ref 0.00–0.07)
Basophils Absolute: 0.1 10*3/uL (ref 0.0–0.1)
Basophils Relative: 3 %
Eosinophils Absolute: 0.1 10*3/uL (ref 0.0–0.5)
Eosinophils Relative: 3 %
HCT: 42.1 % (ref 36.0–46.0)
Hemoglobin: 14.3 g/dL (ref 12.0–15.0)
Immature Granulocytes: 1 %
Lymphocytes Relative: 26 %
Lymphs Abs: 1 10*3/uL (ref 0.7–4.0)
MCH: 36.6 pg — ABNORMAL HIGH (ref 26.0–34.0)
MCHC: 34 g/dL (ref 30.0–36.0)
MCV: 107.7 fL — ABNORMAL HIGH (ref 80.0–100.0)
Monocytes Absolute: 0.3 10*3/uL (ref 0.1–1.0)
Monocytes Relative: 9 %
Neutro Abs: 2.2 10*3/uL (ref 1.7–7.7)
Neutrophils Relative %: 58 %
Platelet Count: 149 10*3/uL — ABNORMAL LOW (ref 150–400)
RBC: 3.91 MIL/uL (ref 3.87–5.11)
RDW: 12.5 % (ref 11.5–15.5)
WBC Count: 3.8 10*3/uL — ABNORMAL LOW (ref 4.0–10.5)
nRBC: 0 % (ref 0.0–0.2)

## 2022-08-18 MED ORDER — ZOLEDRONIC ACID 4 MG/5ML IV CONC
3.0000 mg | Freq: Once | INTRAVENOUS | Status: AC
  Administered 2022-08-18: 3 mg via INTRAVENOUS
  Filled 2022-08-18: qty 3.75

## 2022-08-18 MED ORDER — SODIUM CHLORIDE 0.9 % IV SOLN: Freq: Once | INTRAVENOUS | Status: AC

## 2022-08-18 NOTE — Assessment & Plan Note (Addendum)
Right breast biopsy on 09/25/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  Patient moved from [place] because she could not tolerate the heat.   Current treatment: Abemaciclib, anastrozole and Zometa    Toxicities: 1.  Elevated LFTs: Currently on 100 mg p.o. twice daily.  She is tolerating it extremely well with normal LFTs. 2. leukopenia/neutropenia: ANC 2.2 3.  Thrombocytopenia: Platelets 149 today   Bone metastases: On Zometa every 3 months Zometa toxicity: She developed chills aches and pains for 2 days after Zometa infusion.  Therefore we reduced the dosage of Zometa to 3 mg    Hypothyroidism: on thyroid medication. Depression:Starting her on Lexapro   Continue current treatment 11/28/2021: PET CT scan: No significant interval change in bone mets. 05/17/2022: PET CT scan: No evidence of progressive metastatic disease.  Similar bone metastases without hypermetabolism, hepatic steatosis   RTC in 3 months with labs and Zometa PET CT scan will be obtained in 3 months

## 2022-08-18 NOTE — Patient Instructions (Signed)

## 2022-08-24 LAB — THYROID PANEL WITH TSH
Free Thyroxine Index: 2.4 (ref 1.2–4.9)
T3 Uptake Ratio: 26 % (ref 24–39)
T4, Total: 9.4 ug/dL (ref 4.5–12.0)
TSH: 1.81 u[IU]/mL (ref 0.450–4.500)

## 2022-08-28 ENCOUNTER — Telehealth: Payer: Self-pay | Admitting: Hematology and Oncology

## 2022-08-28 NOTE — Telephone Encounter (Signed)
Scheduled appointments per 7/19 los. Patient is aware of the made appointments.

## 2022-09-12 ENCOUNTER — Other Ambulatory Visit: Payer: Self-pay | Admitting: Physician Assistant

## 2022-09-12 ENCOUNTER — Other Ambulatory Visit: Payer: Self-pay | Admitting: Family Medicine

## 2022-09-12 DIAGNOSIS — E039 Hypothyroidism, unspecified: Secondary | ICD-10-CM

## 2022-09-28 ENCOUNTER — Other Ambulatory Visit: Payer: Self-pay | Admitting: Hematology and Oncology

## 2022-10-11 ENCOUNTER — Telehealth: Payer: Self-pay | Admitting: Family Medicine

## 2022-10-11 ENCOUNTER — Ambulatory Visit: Payer: Medicare PPO | Admitting: Family Medicine

## 2022-10-11 ENCOUNTER — Encounter: Payer: Self-pay | Admitting: Family Medicine

## 2022-10-11 VITALS — BP 104/68 | HR 84 | Temp 97.9°F | Ht 65.0 in | Wt 142.5 lb

## 2022-10-11 DIAGNOSIS — M62830 Muscle spasm of back: Secondary | ICD-10-CM | POA: Diagnosis not present

## 2022-10-11 MED ORDER — CYCLOBENZAPRINE HCL 10 MG PO TABS
10.0000 mg | ORAL_TABLET | Freq: Three times a day (TID) | ORAL | 1 refills | Status: DC | PRN
Start: 2022-10-11 — End: 2022-12-11

## 2022-10-11 NOTE — Telephone Encounter (Signed)
Caller name: Zayleah Schuller  On DPR?: Yes  Call back number: [phone] (mobile)  Provider they see: Alveria Apley, NP  Reason for call:   Pt is having back pain and would like cyclobenzaprine (FLEXERIL) 10 MG tablet  [address] [place] [address]

## 2022-10-11 NOTE — Patient Instructions (Signed)
-  Prescribed Cyclobenzaprine 10mg  tablet, 1 tablet every 8 hours as needed for muscle spasms. Advised that this medication can cause drowsiness.  -Follow up if not improved.

## 2022-10-11 NOTE — Telephone Encounter (Signed)
Called pt to schedule her appt to discuss medications Pt reports she was working in her yard over the weekend and hurt her back. Pt is coming in office today at 2:40

## 2022-10-11 NOTE — Progress Notes (Signed)
   Acute Office Visit   Subjective:  Patient ID: Norma Murray, female    DOB: Aug 28, 1950, 72 y.o.   MRN: [id number]  Chief Complaint  Patient presents with   Back Pain    Pt reports back pain. Pt reports she was working in yard over the weekend and was moving some trees and hurt her back. Pt reports spasms today on RT side back     HPI:  Patient is having an acute visit for intermittent muscle spasms. She reports yesterday she was working the yard. She reached for a limb, since she has had constant right lower back muscle spasms. She reports the applied heat and ice to the area last night and Tylenol with little relief. She reports she has a history of this and oncology, Dr. Serena Croissant has prescribed Cyclobenzaprine 10mg  TID as needed for muscle spasms previously that has helped. Medication was last refilled on 06/19/2022. Dr. Pamelia Hoit advised patient to get her next refill request by PCP.   Denies any numbness or tingling in genitals and lower extremities. Denies loss of bowel or bladder.   Review of Systems  Musculoskeletal:  Positive for back pain.   See HPI above      Objective:   BP 104/68   Pulse 84   Temp 97.9 F (36.6 C)   Ht 5\' 5"  (1.651 m)   Wt 142 lb 8 oz (64.6 kg)   SpO2 98%   BMI 23.71 kg/m    Physical Exam Vitals reviewed.  Constitutional:      General: She is not in acute distress.    Appearance: Normal appearance. She is not ill-appearing, toxic-appearing or diaphoretic.  HENT:     Head: Normocephalic and atraumatic.  Eyes:     General:        Right eye: No discharge.        Left eye: No discharge.     Conjunctiva/sclera: Conjunctivae normal.  Cardiovascular:     Rate and Rhythm: Normal rate and regular rhythm.     Heart sounds: Normal heart sounds. No murmur heard.    No friction rub. No gallop.  Pulmonary:     Effort: Pulmonary effort is normal. No respiratory distress.     Breath sounds: Normal breath sounds.  Musculoskeletal:        General:  Normal range of motion.     Lumbar back: Spasms and tenderness (Right side) present. No swelling or deformity.  Skin:    General: Skin is warm and dry.  Neurological:     General: No focal deficit present.     Mental Status: She is alert and oriented to person, place, and time. Mental status is at baseline.  Psychiatric:        Mood and Affect: Mood normal.        Behavior: Behavior normal.        Thought Content: Thought content normal.        Judgment: Judgment normal.       Assessment & Plan:  Lumbar paraspinal muscle spasm -     Cyclobenzaprine HCl; Take 1 tablet (10 mg total) by mouth 3 (three) times daily as needed for muscle spasms.  Dispense: 21 tablet; Refill: 1  -Prescribed Cyclobenzaprine 10mg  tablet, 1 tablet every 8 hours as needed for muscle spasms. Advised that this medication can cause drowsiness.  -Follow up if not improved.   Zandra Abts, NP

## 2022-11-17 ENCOUNTER — Encounter (HOSPITAL_COMMUNITY)
Admission: RE | Admit: 2022-11-17 | Discharge: 2022-11-17 | Disposition: A | Discharge status: Home / Self Care | Payer: Medicare PPO | Source: Ambulatory Visit | Attending: Hematology and Oncology | Admitting: Hematology and Oncology

## 2022-11-17 DIAGNOSIS — C50919 Malignant neoplasm of unspecified site of unspecified female breast: Secondary | ICD-10-CM | POA: Diagnosis not present

## 2022-11-17 DIAGNOSIS — C7951 Secondary malignant neoplasm of bone: Secondary | ICD-10-CM | POA: Diagnosis not present

## 2022-11-17 LAB — GLUCOSE, CAPILLARY: Glucose-Capillary: 99 mg/dL (ref 70–99)

## 2022-11-17 MED ORDER — FLUDEOXYGLUCOSE F - 18 (FDG) INJECTION
7.2000 | Freq: Once | INTRAVENOUS | Status: AC
  Administered 2022-11-17: 7.11 via INTRAVENOUS

## 2022-11-24 ENCOUNTER — Inpatient Hospital Stay: Payer: Medicare PPO

## 2022-11-24 ENCOUNTER — Inpatient Hospital Stay: Payer: Medicare PPO | Admitting: Adult Health

## 2022-11-24 ENCOUNTER — Encounter: Payer: Self-pay | Admitting: Adult Health

## 2022-11-24 ENCOUNTER — Inpatient Hospital Stay: Payer: Medicare PPO | Attending: Hematology and Oncology

## 2022-11-24 VITALS — BP 125/89 | HR 87 | Temp 97.9°F | Resp 18 | Ht 65.0 in | Wt 144.3 lb

## 2022-11-24 DIAGNOSIS — C50919 Malignant neoplasm of unspecified site of unspecified female breast: Secondary | ICD-10-CM | POA: Diagnosis not present

## 2022-11-24 DIAGNOSIS — Z170 Estrogen receptor positive status [ER+]: Secondary | ICD-10-CM | POA: Insufficient documentation

## 2022-11-24 DIAGNOSIS — Z79620 Long term (current) use of immunosuppressive biologic: Secondary | ICD-10-CM | POA: Diagnosis not present

## 2022-11-24 DIAGNOSIS — C50911 Malignant neoplasm of unspecified site of right female breast: Secondary | ICD-10-CM | POA: Insufficient documentation

## 2022-11-24 DIAGNOSIS — C7951 Secondary malignant neoplasm of bone: Secondary | ICD-10-CM | POA: Insufficient documentation

## 2022-11-24 LAB — CBC WITH DIFFERENTIAL (CANCER CENTER ONLY)
Abs Immature Granulocytes: 0.01 10*3/uL (ref 0.00–0.07)
Basophils Absolute: 0.1 10*3/uL (ref 0.0–0.1)
Basophils Relative: 2 %
Eosinophils Absolute: 0.2 10*3/uL (ref 0.0–0.5)
Eosinophils Relative: 5 %
HCT: 39.3 % (ref 36.0–46.0)
Hemoglobin: 13.7 g/dL (ref 12.0–15.0)
Immature Granulocytes: 0 %
Lymphocytes Relative: 30 %
Lymphs Abs: 1.1 10*3/uL (ref 0.7–4.0)
MCH: 37.2 pg — ABNORMAL HIGH (ref 26.0–34.0)
MCHC: 34.9 g/dL (ref 30.0–36.0)
MCV: 106.8 fL — ABNORMAL HIGH (ref 80.0–100.0)
Monocytes Absolute: 0.4 10*3/uL (ref 0.1–1.0)
Monocytes Relative: 11 %
Neutro Abs: 1.9 10*3/uL (ref 1.7–7.7)
Neutrophils Relative %: 52 %
Platelet Count: 148 10*3/uL — ABNORMAL LOW (ref 150–400)
RBC: 3.68 MIL/uL — ABNORMAL LOW (ref 3.87–5.11)
RDW: 12.4 % (ref 11.5–15.5)
WBC Count: 3.6 10*3/uL — ABNORMAL LOW (ref 4.0–10.5)
nRBC: 0 % (ref 0.0–0.2)

## 2022-11-24 LAB — CMP (CANCER CENTER ONLY)
ALT: 38 U/L (ref 0–44)
AST: 37 U/L (ref 15–41)
Albumin: 4.1 g/dL (ref 3.5–5.0)
Alkaline Phosphatase: 75 U/L (ref 38–126)
Anion gap: 4 — ABNORMAL LOW (ref 5–15)
BUN: 21 mg/dL (ref 8–23)
CO2: 26 mmol/L (ref 22–32)
Calcium: 9.3 mg/dL (ref 8.9–10.3)
Chloride: 110 mmol/L (ref 98–111)
Creatinine: 1.19 mg/dL — ABNORMAL HIGH (ref 0.44–1.00)
GFR, Estimated: 49 mL/min — ABNORMAL LOW (ref >60)
Glucose, Bld: 99 mg/dL (ref 70–99)
Potassium: 4.5 mmol/L (ref 3.5–5.1)
Sodium: 140 mmol/L (ref 135–145)
Total Bilirubin: 0.5 mg/dL (ref 0.3–1.2)
Total Protein: 6.7 g/dL (ref 6.5–8.1)

## 2022-11-24 NOTE — Progress Notes (Signed)
[facility] Cancer Follow up:    Alveria Apley, NP [address] [place] [address]   DIAGNOSIS:  Cancer Staging  Primary malignant neoplasm of breast with metastasis [facility]) Staging form: Breast, AJCC 8th Edition - Clinical: Stage IV (pM1) - Signed by Loa Socks, NP on 11/24/2022   SUMMARY OF ONCOLOGIC HISTORY: Oncology History  Primary malignant neoplasm of breast with metastasis (HCC)  09/25/2018 Initial Diagnosis   Right breast biopsy on 09/25/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    05/22/2019 PET scan   PET scan on 05/22/19 showed resolution of the multiple osseous metastatic lesions, right chest wall mass, and bilateral axillary lymphadenopathy.     Anti-estrogen oral therapy   Abemaciclib, Anastrozole and Zometa.  (Moved from [place])   09/10/2019 Genetic Testing   Negative genetic testing:  No pathogenic variants detected on the [facility] Common Hereditary Cancers panel. The report date is 09/10/2019.   The Common Hereditary Cancers Panel offered by [facility] includes sequencing and/or deletion duplication testing of the following 48 genes: APC, ATM, AXIN2, BARD1, BMPR1A, BRCA1, BRCA2, BRIP1, CDH1, CDK4, CDKN2A (p14ARF), CDKN2A (p16INK4a), CHEK2, CTNNA1, DICER1, EPCAM (Deletion/duplication testing only), GREM1 (promoter region deletion/duplication testing only), KIT, MEN1, MLH1, MSH2, MSH3, MSH6, MUTYH, NBN, NF1, NTHL1, PALB2, PDGFRA, PMS2, POLD1, POLE, PTEN, RAD50, RAD51C, RAD51D, RNF43, SDHB, SDHC, SDHD, SMAD4, SMARCA4. STK11, TP53, TSC1, TSC2, and VHL.  The following genes were evaluated for sequence changes only: SDHA and HOXB13 c.251G>A variant only.    11/24/2022 Cancer Staging   Staging form: Breast, AJCC 8th Edition - Clinical: Stage IV (pM1) - Signed by Loa Socks, NP on 11/24/2022     CURRENT THERAPY:  INTERVAL HISTORY: Shreenika Strelow 72 y.o. female returns for follow-up of  her metastatic breast cancer.  She continues on anastrozole, Verzenio, and Zometa.  She is taking Verzenio 100 mg p.o. twice daily.  She is tolerating this well.  Her most recent restaging scan occurred on November 17, 2022 demonstrating no metabolic active recurrent or progressive metastatic disease along with stable widespread treated osseous metastasis.  She notes left shoulder pain.  This is not metabolically active on her PET scan.  She wonders what the etiology is and whether she needs more imaging workup of this area.  Has Flexeril she takes as needed for her shoulder and back pain.  Her GFR is slightly decreased therefore she receive Zometa 3 mg every 12 weeks.  She tells me that she does not drink enough water.  She is otherwise doing well.     Patient Active Problem List   Diagnosis Date Noted   Neuropathy 06/15/2022   Hypothyroidism 06/14/2022   Genetic testing 09/10/2019   Family history of breast cancer    Family history of bone cancer    Family history of lung cancer    Primary malignant neoplasm of breast with metastasis (HCC) 08/12/2019    is allergic to aspirin.  MEDICAL HISTORY: Past Medical History:  Diagnosis Date   Anxiety    Breast cancer (HCC)    Depression    Family history of bone cancer    Family history of breast cancer    Family history of heart disease    Hypothyroidism    Neuropathy     SURGICAL HISTORY: Past Surgical History:  Procedure Laterality Date   APPENDECTOMY     OVARY SURGERY     Removed    SOCIAL HISTORY: Social History  Socioeconomic History   Marital status: Married    Spouse name: Not on file   Number of children: 1   Years of education: High School   Highest education level: 12th grade  Occupational History   Not on file  Tobacco Use   Smoking status: Never   Smokeless tobacco: Never  Vaping Use   Vaping status: Never Used  Substance and Sexual Activity   Alcohol use: Yes    Alcohol/week: 1.0 standard drink of  alcohol    Types: 1 Glasses of wine per week    Comment: 1 glass of wine daily   Drug use: Not Currently   Sexual activity: Not Currently  Other Topics Concern   Not on file  Social History Narrative   Not on file   Social Determinants of Health   Financial Resource Strain: Low Risk  (07/13/2022)   Overall Financial Resource Strain (CARDIA)    Difficulty of Paying Living Expenses: Not hard at all  Food Insecurity: No Food Insecurity (07/13/2022)   Hunger Vital Sign    Worried About Running Out of Food in the Last Year: Never true    Ran Out of Food in the Last Year: Never true  Transportation Needs: No Transportation Needs (07/13/2022)   PRAPARE - Administrator, Civil Service (Medical): No    Lack of Transportation (Non-Medical): No  Physical Activity: Inactive (07/13/2022)   Exercise Vital Sign    Days of Exercise per Week: 0 days    Minutes of Exercise per Session: 0 min  Stress: Stress Concern Present (07/13/2022)   [employer] of Occupational Health - Occupational Stress Questionnaire    Feeling of Stress : Rather much  Social Connections: Moderately Isolated (07/13/2022)   Social Connection and Isolation Panel [NHANES]    Frequency of Communication with Friends and Family: More than three times a week    Frequency of Social Gatherings with Friends and Family: Twice a week    Attends Religious Services: Never    Database administrator or Organizations: No    Attends Banker Meetings: Never    Marital Status: Married  Catering manager Violence: Not At Risk (07/13/2022)   Humiliation, Afraid, Rape, and Kick questionnaire    Fear of Current or Ex-Partner: No    Emotionally Abused: No    Physically Abused: No    Sexually Abused: No    FAMILY HISTORY: Family History  Problem Relation Age of Onset   Hypertension Mother    Thyroid disease Mother    Heart disease Father    Thyroid disease Sister    Thyroid disease Sister    Breast cancer Maternal  Aunt 30   Breast cancer Maternal Aunt 40   Mesothelioma Maternal Uncle        worked on railroad   Lung cancer Maternal Uncle 83       smoker   Cirrhosis Maternal Grandfather        liver    Review of Systems  Constitutional:  Negative for appetite change, chills, fatigue, fever and unexpected weight change.  HENT:   Negative for hearing loss, lump/mass and trouble swallowing.   Eyes:  Negative for eye problems and icterus.  Respiratory:  Negative for chest tightness, cough and shortness of breath.   Cardiovascular:  Negative for chest pain, leg swelling and palpitations.  Gastrointestinal:  Negative for abdominal distention, abdominal pain, constipation, diarrhea, nausea and vomiting.  Endocrine: Negative for hot flashes.  Genitourinary:  Negative  for difficulty urinating.   Musculoskeletal:  Negative for arthralgias.  Skin:  Negative for itching and rash.  Neurological:  Negative for dizziness, extremity weakness, headaches and numbness.  Hematological:  Negative for adenopathy. Does not bruise/bleed easily.  Psychiatric/Behavioral:  Negative for depression. The patient is not nervous/anxious.       PHYSICAL EXAMINATION    Vitals:   11/24/22 0825  BP: 125/89  Pulse: 87  Resp: 18  Temp: 97.9 F (36.6 C)  SpO2: 100%    Physical Exam Constitutional:      General: She is not in acute distress.    Appearance: Normal appearance. She is not toxic-appearing.  HENT:     Head: Normocephalic and atraumatic.     Mouth/Throat:     Mouth: Mucous membranes are moist.     Pharynx: Oropharynx is clear. No oropharyngeal exudate or posterior oropharyngeal erythema.  Eyes:     General: No scleral icterus. Cardiovascular:     Rate and Rhythm: Normal rate and regular rhythm.     Pulses: Normal pulses.     Heart sounds: Normal heart sounds.  Pulmonary:     Effort: Pulmonary effort is normal.     Breath sounds: Normal breath sounds.  Abdominal:     General: Abdomen is flat.  Bowel sounds are normal. There is no distension.     Palpations: Abdomen is soft.     Tenderness: There is no abdominal tenderness.  Musculoskeletal:        General: No swelling.     Cervical back: Neck supple.     Comments: Left shoulder difficulty with range of motion.  Lymphadenopathy:     Cervical: No cervical adenopathy.  Skin:    General: Skin is warm and dry.     Findings: No rash.  Neurological:     General: No focal deficit present.     Mental Status: She is alert.  Psychiatric:        Mood and Affect: Mood normal.        Behavior: Behavior normal.     LABORATORY DATA:  CBC    Component Value Date/Time   WBC 3.6 (L) 11/24/2022 0759   RBC 3.68 (L) 11/24/2022 0759   HGB 13.7 11/24/2022 0759   HCT 39.3 11/24/2022 0759   PLT 148 (L) 11/24/2022 0759   MCV 106.8 (H) 11/24/2022 0759   MCH 37.2 (H) 11/24/2022 0759   MCHC 34.9 11/24/2022 0759   RDW 12.4 11/24/2022 0759   LYMPHSABS 1.1 11/24/2022 0759   MONOABS 0.4 11/24/2022 0759   EOSABS 0.2 11/24/2022 0759   BASOSABS 0.1 11/24/2022 0759    CMP     Component Value Date/Time   NA 140 11/24/2022 0759   K 4.5 11/24/2022 0759   CL 110 11/24/2022 0759   CO2 26 11/24/2022 0759   GLUCOSE 99 11/24/2022 0759   BUN 21 11/24/2022 0759   CREATININE 1.19 (H) 11/24/2022 0759   CALCIUM 9.3 11/24/2022 0759   PROT 6.7 11/24/2022 0759   ALBUMIN 4.1 11/24/2022 0759   AST 37 11/24/2022 0759   ALT 38 11/24/2022 0759   ALKPHOS 75 11/24/2022 0759   BILITOT 0.5 11/24/2022 0759   GFRNONAA 49 (L) 11/24/2022 0759   GFRAA 53 (L) 10/10/2019 0810     ASSESSMENT and THERAPY PLAN:   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  Patient moved from [place] because she could not  tolerate the heat.   Current treatment: Abemaciclib, anastrozole and Zometa    Toxicities: 1.  Elevated LFTs: Currently on 100 mg p.o.  twice daily.  She is tolerating it extremely well with normal LFTs. 2. leukopenia/neutropenia: ANC 2.2 3.  Thrombocytopenia: Platelets 149 today   Bone metastases: On Zometa every 3 months Zometa toxicity: She developed chills aches and pains for 2 days after Zometa infusion.  Therefore we reduced the dosage of Zometa to 3 mg--which is also the recommended dose considering her declined GFR.   Hypothyroidism: on thyroid medication. Depression:Lexapro   Continue current treatment 11/28/2021: PET CT scan: No significant interval change in bone mets. 05/17/2022: PET CT scan: No evidence of progressive metastatic disease.  Similar bone metastases without hypermetabolism, hepatic steatosis 11/17/2022: PET CT scan: No evidence of progressive metastatic disease.  Similar bone metastasis without hypermetabolism.  Left shoulder pain: There is no new hypermetabolism in this area on her PET scan.  I offered to make referral to Dr. Clementeen Graham and [facility] who could help [place] pinpoint the appropriate imaging and treatment for her left shoulder pain.  I gave her his name and she plans on considering this.  She does not want to receive this omitted today.  Therefore we will wait another week.  I let her know due to her decline in GFR we will need to repeat labs next week as well.  RTC in 12 weeks for labs, f/u, and Zometa.  Repeat scans in 6 months.     All questions were answered. The patient knows to call the clinic with any problems, questions or concerns. We can certainly see the patient much sooner if necessary.  Total encounter time:20 minutes*in face-to-face visit time, chart review, lab review, care coordination, order entry, and documentation of the encounter time.    Lillard Anes, NP 11/24/22 9:41 AM Medical Oncology and Hematology [facility] [address] [place], [place] [address] Tel. [phone]    Fax. [phone]  *Total Encounter Time as defined by  the Centers for Medicare and Medicaid Services includes, in addition to the face-to-face time of a patient visit (documented in the note above) non-face-to-face time: obtaining and reviewing outside history, ordering and reviewing medications, tests or procedures, care coordination (communications with other health care professionals or caregivers) and documentation in the medical record.

## 2022-11-24 NOTE — Assessment & Plan Note (Addendum)
Right breast biopsy on 09/25/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  Patient moved from [place] because she could not tolerate the heat.   Current treatment: Abemaciclib, anastrozole and Zometa    Toxicities: 1.  Elevated LFTs: Currently on 100 mg p.o. twice daily.  She is tolerating it extremely well with normal LFTs. 2. leukopenia/neutropenia: ANC 2.2 3.  Thrombocytopenia: Platelets 149 today   Bone metastases: On Zometa every 3 months Zometa toxicity: She developed chills aches and pains for 2 days after Zometa infusion.  Therefore we reduced the dosage of Zometa to 3 mg--which is also the recommended dose considering her declined GFR.   Hypothyroidism: on thyroid medication. Depression:Lexapro   Continue current treatment 11/28/2021: PET CT scan: No significant interval change in bone mets. 05/17/2022: PET CT scan: No evidence of progressive metastatic disease.  Similar bone metastases without hypermetabolism, hepatic steatosis 11/17/2022: PET CT scan: No evidence of progressive metastatic disease.  Similar bone metastasis without hypermetabolism.  Left shoulder pain: There is no new hypermetabolism in this area on her PET scan.  I offered to make referral to Dr. Clementeen Graham and [place] [facility] who could help [place] pinpoint the appropriate imaging and treatment for her left shoulder pain.  I gave her his name and she plans on considering this.  She does not want to receive this omitted today.  Therefore we will wait another week.  I let her know due to her decline in GFR we will need to repeat labs next week as well.  RTC in 12 weeks for labs, f/u, and Zometa.  Repeat scans in 6 months.

## 2022-11-25 LAB — THYROID PANEL WITH TSH
Free Thyroxine Index: 1.7 (ref 1.2–4.9)
T3 Uptake Ratio: 21 % — ABNORMAL LOW (ref 24–39)
T4, Total: 8.2 ug/dL (ref 4.5–12.0)
TSH: 0.817 u[IU]/mL (ref 0.450–4.500)

## 2022-11-28 ENCOUNTER — Other Ambulatory Visit: Payer: Medicare PPO

## 2022-11-28 ENCOUNTER — Inpatient Hospital Stay: Payer: Medicare PPO

## 2022-11-28 VITALS — BP 145/81 | HR 86 | Resp 16

## 2022-11-28 DIAGNOSIS — Z79620 Long term (current) use of immunosuppressive biologic: Secondary | ICD-10-CM | POA: Diagnosis not present

## 2022-11-28 DIAGNOSIS — C50919 Malignant neoplasm of unspecified site of unspecified female breast: Secondary | ICD-10-CM

## 2022-11-28 DIAGNOSIS — Z170 Estrogen receptor positive status [ER+]: Secondary | ICD-10-CM | POA: Diagnosis not present

## 2022-11-28 DIAGNOSIS — C7951 Secondary malignant neoplasm of bone: Secondary | ICD-10-CM | POA: Diagnosis not present

## 2022-11-28 DIAGNOSIS — C50911 Malignant neoplasm of unspecified site of right female breast: Secondary | ICD-10-CM | POA: Diagnosis not present

## 2022-11-28 LAB — CMP (CANCER CENTER ONLY)
ALT: 36 U/L (ref 0–44)
AST: 31 U/L (ref 15–41)
Albumin: 4 g/dL (ref 3.5–5.0)
Alkaline Phosphatase: 76 U/L (ref 38–126)
Anion gap: 6 (ref 5–15)
BUN: 15 mg/dL (ref 8–23)
CO2: 24 mmol/L (ref 22–32)
Calcium: 9.4 mg/dL (ref 8.9–10.3)
Chloride: 109 mmol/L (ref 98–111)
Creatinine: 1.07 mg/dL — ABNORMAL HIGH (ref 0.44–1.00)
GFR, Estimated: 56 mL/min — ABNORMAL LOW (ref >60)
Glucose, Bld: 95 mg/dL (ref 70–99)
Potassium: 4.1 mmol/L (ref 3.5–5.1)
Sodium: 139 mmol/L (ref 135–145)
Total Bilirubin: 0.6 mg/dL (ref 0.3–1.2)
Total Protein: 6.5 g/dL (ref 6.5–8.1)

## 2022-11-28 LAB — CBC WITH DIFFERENTIAL (CANCER CENTER ONLY)
Abs Immature Granulocytes: 0 10*3/uL (ref 0.00–0.07)
Basophils Absolute: 0.1 10*3/uL (ref 0.0–0.1)
Basophils Relative: 2 %
Eosinophils Absolute: 0.1 10*3/uL (ref 0.0–0.5)
Eosinophils Relative: 4 %
HCT: 38.8 % (ref 36.0–46.0)
Hemoglobin: 13.5 g/dL (ref 12.0–15.0)
Immature Granulocytes: 0 %
Lymphocytes Relative: 28 %
Lymphs Abs: 0.9 10*3/uL (ref 0.7–4.0)
MCH: 36.9 pg — ABNORMAL HIGH (ref 26.0–34.0)
MCHC: 34.8 g/dL (ref 30.0–36.0)
MCV: 106 fL — ABNORMAL HIGH (ref 80.0–100.0)
Monocytes Absolute: 0.4 10*3/uL (ref 0.1–1.0)
Monocytes Relative: 12 %
Neutro Abs: 1.8 10*3/uL (ref 1.7–7.7)
Neutrophils Relative %: 54 %
Platelet Count: 152 10*3/uL (ref 150–400)
RBC: 3.66 MIL/uL — ABNORMAL LOW (ref 3.87–5.11)
RDW: 12.2 % (ref 11.5–15.5)
WBC Count: 3.4 10*3/uL — ABNORMAL LOW (ref 4.0–10.5)
nRBC: 0 % (ref 0.0–0.2)

## 2022-11-28 MED ORDER — SODIUM CHLORIDE 0.9 % IV SOLN: Freq: Once | INTRAVENOUS | Status: AC

## 2022-11-28 MED ORDER — ZOLEDRONIC ACID 4 MG/5ML IV CONC
3.0000 mg | Freq: Once | INTRAVENOUS | Status: AC
  Administered 2022-11-28: 3 mg via INTRAVENOUS
  Filled 2022-11-28: qty 3.75

## 2022-11-28 NOTE — Patient Instructions (Signed)

## 2022-11-29 LAB — THYROID PANEL WITH TSH
Free Thyroxine Index: 1.7 (ref 1.2–4.9)
T3 Uptake Ratio: 22 % — ABNORMAL LOW (ref 24–39)
T4, Total: 7.7 ug/dL (ref 4.5–12.0)
TSH: 1.33 u[IU]/mL (ref 0.450–4.500)

## 2022-12-04 ENCOUNTER — Other Ambulatory Visit: Payer: Self-pay | Admitting: Family Medicine

## 2022-12-04 DIAGNOSIS — E039 Hypothyroidism, unspecified: Secondary | ICD-10-CM

## 2022-12-07 ENCOUNTER — Telehealth: Payer: Self-pay | Admitting: Pharmacy Technician

## 2022-12-07 NOTE — Telephone Encounter (Signed)
Oral Oncology Patient Advocate Encounter   Received notification that patient is due for re-enrollment for assistance for Verzenio through [employer].   Re-enrollment process has been initiated and will be submitted upon completion of necessary documents.  Patient agreed to sign documents 12/13/22 at the office.  [employer] number [phone].   I will continue to follow until final determination.  Jinger Neighbors, CPhT-Adv Oncology Pharmacy Patient Advocate [facility] Direct Number: [phone]  Fax: [phone]

## 2022-12-08 ENCOUNTER — Other Ambulatory Visit: Payer: Self-pay | Admitting: Hematology and Oncology

## 2022-12-08 DIAGNOSIS — C50919 Malignant neoplasm of unspecified site of unspecified female breast: Secondary | ICD-10-CM

## 2022-12-10 ENCOUNTER — Other Ambulatory Visit: Payer: Self-pay | Admitting: Family Medicine

## 2022-12-10 DIAGNOSIS — M62830 Muscle spasm of back: Secondary | ICD-10-CM

## 2022-12-13 NOTE — Telephone Encounter (Signed)
Oral Oncology Patient Advocate Encounter   Submitted application for assistance for Verzenio to [employer].   Application submitted via e-fax to [phone]   [employer] phone number [phone].   I will continue to check the status until final determination.   Darey Hershberger, CPhT-Adv Oncology Pharmacy Patient Advocate [facility] Direct Number: [phone]  Fax: [phone]   

## 2022-12-13 NOTE — Telephone Encounter (Signed)
Oral Oncology Patient Advocate Encounter   Received notification re-enrollment for assistance for Verzenio through [employer] has been approved. Patient may continue to receive their medication at $0 from this program.    [employer] phone number [phone].   Effective dates: 02/07/23 through 02/06/24  I have spoken to the patient.  Jinger Neighbors, CPhT-Adv Oncology Pharmacy Patient Advocate [facility] Direct Number: [phone]  Fax: [phone]

## 2023-01-17 ENCOUNTER — Other Ambulatory Visit: Payer: Self-pay | Admitting: *Deleted

## 2023-01-17 ENCOUNTER — Other Ambulatory Visit: Payer: Self-pay | Admitting: Hematology and Oncology

## 2023-01-17 DIAGNOSIS — C50919 Malignant neoplasm of unspecified site of unspecified female breast: Secondary | ICD-10-CM

## 2023-01-17 MED ORDER — ABEMACICLIB 100 MG PO TABS: 100.0000 mg | ORAL_TABLET | Freq: Two times a day (BID) | ORAL | 0 refills | Status: DC

## 2023-02-20 ENCOUNTER — Inpatient Hospital Stay: Payer: Medicare PPO

## 2023-02-20 ENCOUNTER — Encounter: Payer: Self-pay | Admitting: Hematology and Oncology

## 2023-02-20 ENCOUNTER — Inpatient Hospital Stay: Payer: Medicare PPO | Attending: Hematology and Oncology | Admitting: Hematology and Oncology

## 2023-02-20 VITALS — BP 143/65 | HR 83 | Temp 98.2°F | Resp 18 | Ht 65.0 in | Wt 148.4 lb

## 2023-02-20 DIAGNOSIS — G629 Polyneuropathy, unspecified: Secondary | ICD-10-CM | POA: Insufficient documentation

## 2023-02-20 DIAGNOSIS — D696 Thrombocytopenia, unspecified: Secondary | ICD-10-CM | POA: Diagnosis not present

## 2023-02-20 DIAGNOSIS — Z170 Estrogen receptor positive status [ER+]: Secondary | ICD-10-CM | POA: Diagnosis not present

## 2023-02-20 DIAGNOSIS — C50911 Malignant neoplasm of unspecified site of right female breast: Secondary | ICD-10-CM | POA: Insufficient documentation

## 2023-02-20 DIAGNOSIS — Z79899 Other long term (current) drug therapy: Secondary | ICD-10-CM | POA: Diagnosis not present

## 2023-02-20 DIAGNOSIS — E039 Hypothyroidism, unspecified: Secondary | ICD-10-CM | POA: Diagnosis not present

## 2023-02-20 DIAGNOSIS — Z79811 Long term (current) use of aromatase inhibitors: Secondary | ICD-10-CM | POA: Diagnosis not present

## 2023-02-20 DIAGNOSIS — C50919 Malignant neoplasm of unspecified site of unspecified female breast: Secondary | ICD-10-CM

## 2023-02-20 DIAGNOSIS — F32A Depression, unspecified: Secondary | ICD-10-CM | POA: Diagnosis not present

## 2023-02-20 DIAGNOSIS — D72819 Decreased white blood cell count, unspecified: Secondary | ICD-10-CM | POA: Insufficient documentation

## 2023-02-20 DIAGNOSIS — C7951 Secondary malignant neoplasm of bone: Secondary | ICD-10-CM | POA: Diagnosis not present

## 2023-02-20 LAB — CBC WITH DIFFERENTIAL (CANCER CENTER ONLY)
Abs Immature Granulocytes: 0.03 10*3/uL (ref 0.00–0.07)
Basophils Absolute: 0.1 10*3/uL (ref 0.0–0.1)
Basophils Relative: 1 %
Eosinophils Absolute: 0.2 10*3/uL (ref 0.0–0.5)
Eosinophils Relative: 4 %
HCT: 41 % (ref 36.0–46.0)
Hemoglobin: 14 g/dL (ref 12.0–15.0)
Immature Granulocytes: 1 %
Lymphocytes Relative: 24 %
Lymphs Abs: 1.1 10*3/uL (ref 0.7–4.0)
MCH: 36.3 pg — ABNORMAL HIGH (ref 26.0–34.0)
MCHC: 34.1 g/dL (ref 30.0–36.0)
MCV: 106.2 fL — ABNORMAL HIGH (ref 80.0–100.0)
Monocytes Absolute: 0.6 10*3/uL (ref 0.1–1.0)
Monocytes Relative: 12 %
Neutro Abs: 2.7 10*3/uL (ref 1.7–7.7)
Neutrophils Relative %: 58 %
Platelet Count: 156 10*3/uL (ref 150–400)
RBC: 3.86 MIL/uL — ABNORMAL LOW (ref 3.87–5.11)
RDW: 12.5 % (ref 11.5–15.5)
WBC Count: 4.6 10*3/uL (ref 4.0–10.5)
nRBC: 0 % (ref 0.0–0.2)

## 2023-02-20 LAB — CMP (CANCER CENTER ONLY)
ALT: 30 U/L (ref 0–44)
AST: 25 U/L (ref 15–41)
Albumin: 4.2 g/dL (ref 3.5–5.0)
Alkaline Phosphatase: 83 U/L (ref 38–126)
Anion gap: 5 (ref 5–15)
BUN: 18 mg/dL (ref 8–23)
CO2: 28 mmol/L (ref 22–32)
Calcium: 9.3 mg/dL (ref 8.9–10.3)
Chloride: 107 mmol/L (ref 98–111)
Creatinine: 1.2 mg/dL — ABNORMAL HIGH (ref 0.44–1.00)
GFR, Estimated: 48 mL/min — ABNORMAL LOW (ref >60)
Glucose, Bld: 91 mg/dL (ref 70–99)
Potassium: 4.5 mmol/L (ref 3.5–5.1)
Sodium: 140 mmol/L (ref 135–145)
Total Bilirubin: 0.7 mg/dL (ref 0.0–1.2)
Total Protein: 6.6 g/dL (ref 6.5–8.1)

## 2023-02-20 MED ORDER — ZOLEDRONIC ACID 4 MG/5ML IV CONC
3.0000 mg | Freq: Once | INTRAVENOUS | Status: AC
Start: 2023-02-20 — End: 2023-02-20
  Administered 2023-02-20: 3 mg via INTRAVENOUS
  Filled 2023-02-20: qty 3.75

## 2023-02-20 MED ORDER — SODIUM CHLORIDE 0.9 % IV SOLN: Freq: Once | INTRAVENOUS | Status: AC

## 2023-02-20 NOTE — Progress Notes (Signed)
 Patient Care Team: Billy Philippe SAUNDERS, NP as PCP - General (Family Medicine) Odean Potts, MD as Consulting Physician (Hematology and Oncology) [facility], [facility]  DIAGNOSIS:  Encounter Diagnosis  Name Primary?   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    05/22/2019 PET scan   PET scan on 05/22/19 showed resolution of the multiple osseous metastatic lesions, right chest wall mass, and bilateral axillary lymphadenopathy.     Anti-estrogen oral therapy   Abemaciclib , Anastrozole  and Zometa .  (Moved from [place] )   09/10/2019 Genetic Testing   Negative genetic testing:  No pathogenic variants detected on the [facility] Common Hereditary Cancers panel. The report date is 09/10/2019.   The Common Hereditary Cancers Panel offered by [facility] includes sequencing and/or deletion duplication testing of the following 48 genes: APC, ATM, AXIN2, BARD1, BMPR1A, BRCA1, BRCA2, BRIP1, CDH1, CDK4, CDKN2A (p14ARF), CDKN2A (p16INK4a), CHEK2, CTNNA1, DICER1, EPCAM (Deletion/duplication testing only), GREM1 (promoter region deletion/duplication testing only), KIT, MEN1, MLH1, MSH2, MSH3, MSH6, MUTYH, NBN, NF1, NTHL1, PALB2, PDGFRA, PMS2, POLD1, POLE, PTEN, RAD50, RAD51C, RAD51D, RNF43, SDHB, SDHC, SDHD, SMAD4, SMARCA4. STK11, TP53, TSC1, TSC2, and VHL.  The following genes were evaluated for sequence changes only: SDHA and HOXB13 c.251G>A variant only.    11/24/2022 Cancer Staging   Staging form: Breast, AJCC 8th Edition - Clinical: Stage IV (pM1) - Signed by Crawford Morna Pickle, NP on 11/24/2022     CHIEF COMPLIANT: Follow-up of metastatic breast cancer on Verzinio, anastrozole , Zometa   HISTORY OF PRESENT  ILLNESS:  History of Present Illness   Norma Murray, a patient with a history of met breast cancer on Verzinio, presents with neuropathy. She describes a sensation of 'crunching' and tingling in her feet, along with cramping in her legs. She also reports bruising. The cramps can be severe, feeling as though her leg is being twisted. She has been prescribed gabapentin  for the neuropathy, but she has been hesitant to take it regularly due to its side effects of fatigue and sleepiness. Despite the discomfort, the neuropathy has not significantly limited her physical activities.  In addition to the neuropathy, she mentions her son's potential diagnosis of Alzheimer's disease and her concerns about finding him a good neurologist. She also discusses her recent weight gain, attributing it to the holiday season and a lack of exercise due to the winter weather.         ALLERGIES:  is allergic to aspirin.  MEDICATIONS:  Current Outpatient Medications  Medication Sig Dispense Refill   anastrozole  (ARIMIDEX ) 1 MG tablet TAKE 1 TABLET BY MOUTH EVERY DAY 90 tablet 3   cyclobenzaprine  (FLEXERIL ) 10 MG tablet TAKE 1 TABLET BY MOUTH THREE TIMES A DAY AS NEEDED FOR MUSCLE SPASMS 21 tablet 1   gabapentin  (NEURONTIN ) 100 MG capsule Take 1 capsule (100 mg total) by mouth 2 (two) times daily. 60 capsule 0   levothyroxine  (SYNTHROID ) 50 MCG tablet TAKE 1 TABLET BY MOUTH DAILY BEFORE BREAKFAST 90 tablet 0   Turmeric (QC TUMERIC COMPLEX) 500 MG CAPS Take 1 tablet by mouth daily.     VERZENIO  100 MG tablet TAKE 1 TABLET BY MOUTH TWICE DAILY 56 tablet 3   No current facility-administered medications for this visit.    PHYSICAL EXAMINATION: ECOG PERFORMANCE STATUS: 1 -  Symptomatic but completely ambulatory  Vitals:   02/20/23 0817  BP: (!) 143/65  Pulse: 83  Resp: 18  Temp: 98.2 F (36.8 C)  SpO2: 100%   Filed Weights   02/20/23 0817  Weight: 148 lb 6.4 oz (67.3 kg)    LABORATORY DATA:  I have reviewed the  data as listed    Latest Ref Rng & Units 11/28/2022    7:43 AM 11/24/2022    7:59 AM 08/18/2022    7:44 AM  CMP  Glucose 70 - 99 mg/dL 95  99  79   BUN 8 - 23 mg/dL 15  21  15    Creatinine 0.44 - 1.00 mg/dL 8.92  8.80  8.92   Sodium 135 - 145 mmol/L 139  140  139   Potassium 3.5 - 5.1 mmol/L 4.1  4.5  4.8   Chloride 98 - 111 mmol/L 109  110  107   CO2 22 - 32 mmol/L 24  26  25    Calcium 8.9 - 10.3 mg/dL 9.4  9.3  9.2   Total Protein 6.5 - 8.1 g/dL 6.5  6.7  6.7   Total Bilirubin 0.3 - 1.2 mg/dL 0.6  0.5  0.6   Alkaline Phos 38 - 126 U/L 76  75  69   AST 15 - 41 U/L 31  37  24   ALT 0 - 44 U/L 36  38  29     Lab Results  Component Value Date   WBC 4.6 02/20/2023   HGB 14.0 02/20/2023   HCT 41.0 02/20/2023   MCV 106.2 (H) 02/20/2023   PLT 156 02/20/2023   NEUTROABS 2.7 02/20/2023    ASSESSMENT & PLAN:  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  Patient moved from [place]  because she could not tolerate the heat.   Current treatment: Abemaciclib , anastrozole  and Zometa     Toxicities: 1.  Elevated LFTs: Currently on 100 mg p.o. twice daily.  She is tolerating it extremely well with normal LFTs. 2. leukopenia/neutropenia: ANC 2.2 3.  Thrombocytopenia: Platelets 149 today   Bone metastases: On Zometa  every 3 months Zometa  toxicity: She developed chills aches and pains for 2 days after Zometa  infusion.  Therefore we reduced the dosage of Zometa  to 3 mg    Hypothyroidism: on thyroid  medication. Depression:Starting her on Lexapro    Continue current treatment 11/28/2021: PET CT scan: No significant interval change in bone mets. 05/17/2022: PET CT scan: No evidence of progressive metastatic disease.  Similar bone metastases without hypermetabolism, hepatic steatosis 11/23/2022: PET/CT: Stable disease, no metabolically active recurrent or progressive  disease, stable bone mets, hepatic steatosis   RTC in 3 months with labs and Zometa  and PET/CT    Orders Placed This Encounter  Procedures   NM PET Image Restag (PS) Skull Base To Thigh    Standing Status:   Future    Expected Date:   05/21/2023    Expiration Date:   02/20/2024    If indicated for the ordered procedure, I authorize the administration of a radiopharmaceutical per Radiology protocol:   Yes    Preferred imaging location?:   [facility]    Release to patient:   Immediate   CBC with Differential (Cancer Center Only)    Standing Status:   Future    Expiration Date:   02/20/2024   CMP (Cancer Center only)    Standing Status:  Future    Expiration Date:   02/20/2024   Thyroid  Panel With TSH    Standing Status:   Future    Expiration Date:   02/20/2024   The patient has a good understanding of the overall plan. she agrees with it. she will call with any problems that may develop before the next visit here. Total time spent: 30 mins including face to face time and time spent for planning, charting and co-ordination of care   Viinay K Rayson Rando, MD 02/20/23

## 2023-02-20 NOTE — Patient Instructions (Signed)

## 2023-02-20 NOTE — Assessment & Plan Note (Signed)
 Right breast biopsy on 09/25/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  Patient moved from [place]  because she could not tolerate the heat.   Current treatment: Abemaciclib , anastrozole  and Zometa     Toxicities: 1.  Elevated LFTs: Currently on 100 mg p.o. twice daily.  She is tolerating it extremely well with normal LFTs. 2. leukopenia/neutropenia: ANC 2.2 3.  Thrombocytopenia: Platelets 149 today   Bone metastases: On Zometa  every 3 months Zometa  toxicity: She developed chills aches and pains for 2 days after Zometa  infusion.  Therefore we reduced the dosage of Zometa  to 3 mg    Hypothyroidism: on thyroid  medication. Depression:Starting her on Lexapro    Continue current treatment 11/28/2021: PET CT scan: No significant interval change in bone mets. 05/17/2022: PET CT scan: No evidence of progressive metastatic disease.  Similar bone metastases without hypermetabolism, hepatic steatosis 11/23/2022: PET/CT: Stable disease, no metabolically active recurrent or progressive disease, stable bone mets, hepatic steatosis   RTC in 3 months with labs and Zometa  CT scan will be obtained in 6 months

## 2023-02-21 LAB — THYROID PANEL WITH TSH
Free Thyroxine Index: 1.7 (ref 1.2–4.9)
T3 Uptake Ratio: 23 % — ABNORMAL LOW (ref 24–39)
T4, Total: 7.4 ug/dL (ref 4.5–12.0)
TSH: 1.29 u[IU]/mL (ref 0.450–4.500)

## 2023-02-25 ENCOUNTER — Other Ambulatory Visit: Payer: Self-pay | Admitting: Family Medicine

## 2023-02-25 DIAGNOSIS — E039 Hypothyroidism, unspecified: Secondary | ICD-10-CM

## 2023-04-17 ENCOUNTER — Other Ambulatory Visit: Payer: Self-pay | Admitting: Hematology and Oncology

## 2023-04-17 DIAGNOSIS — C50919 Malignant neoplasm of unspecified site of unspecified female breast: Secondary | ICD-10-CM

## 2023-05-10 ENCOUNTER — Ambulatory Visit (HOSPITAL_COMMUNITY)
Admission: RE | Admit: 2023-05-10 | Discharge: 2023-05-10 | Disposition: A | Discharge status: Home / Self Care | Source: Ambulatory Visit | Attending: Hematology and Oncology | Admitting: Hematology and Oncology

## 2023-05-10 DIAGNOSIS — C50919 Malignant neoplasm of unspecified site of unspecified female breast: Secondary | ICD-10-CM | POA: Diagnosis not present

## 2023-05-10 LAB — GLUCOSE, CAPILLARY: Glucose-Capillary: 93 mg/dL (ref 70–99)

## 2023-05-10 MED ORDER — FLUDEOXYGLUCOSE F - 18 (FDG) INJECTION
7.3900 | Freq: Once | INTRAVENOUS | Status: AC
  Administered 2023-05-10: 7.39 via INTRAVENOUS

## 2023-05-14 ENCOUNTER — Telehealth: Payer: Self-pay

## 2023-05-14 NOTE — Telephone Encounter (Signed)
 Placed call to [facility] reading room for pt's PET scan to be read for MD appt 05/15/23.

## 2023-05-15 ENCOUNTER — Inpatient Hospital Stay: Payer: Medicare PPO

## 2023-05-15 ENCOUNTER — Inpatient Hospital Stay: Payer: Medicare PPO | Attending: Hematology and Oncology | Admitting: Hematology and Oncology

## 2023-05-15 VITALS — BP 141/90 | HR 86 | Temp 98.3°F | Resp 18 | Ht 65.0 in | Wt 151.0 lb

## 2023-05-15 DIAGNOSIS — D696 Thrombocytopenia, unspecified: Secondary | ICD-10-CM | POA: Insufficient documentation

## 2023-05-15 DIAGNOSIS — Z170 Estrogen receptor positive status [ER+]: Secondary | ICD-10-CM | POA: Insufficient documentation

## 2023-05-15 DIAGNOSIS — C7951 Secondary malignant neoplasm of bone: Secondary | ICD-10-CM | POA: Diagnosis not present

## 2023-05-15 DIAGNOSIS — C50911 Malignant neoplasm of unspecified site of right female breast: Secondary | ICD-10-CM | POA: Insufficient documentation

## 2023-05-15 DIAGNOSIS — F32A Depression, unspecified: Secondary | ICD-10-CM | POA: Insufficient documentation

## 2023-05-15 DIAGNOSIS — D72819 Decreased white blood cell count, unspecified: Secondary | ICD-10-CM | POA: Insufficient documentation

## 2023-05-15 DIAGNOSIS — Z79811 Long term (current) use of aromatase inhibitors: Secondary | ICD-10-CM | POA: Insufficient documentation

## 2023-05-15 DIAGNOSIS — R7989 Other specified abnormal findings of blood chemistry: Secondary | ICD-10-CM | POA: Diagnosis not present

## 2023-05-15 DIAGNOSIS — C50919 Malignant neoplasm of unspecified site of unspecified female breast: Secondary | ICD-10-CM

## 2023-05-15 DIAGNOSIS — E039 Hypothyroidism, unspecified: Secondary | ICD-10-CM | POA: Insufficient documentation

## 2023-05-15 LAB — CBC WITH DIFFERENTIAL (CANCER CENTER ONLY)
Abs Immature Granulocytes: 0.01 10*3/uL (ref 0.00–0.07)
Basophils Absolute: 0.1 10*3/uL (ref 0.0–0.1)
Basophils Relative: 2 %
Eosinophils Absolute: 0.2 10*3/uL (ref 0.0–0.5)
Eosinophils Relative: 5 %
HCT: 39.6 % (ref 36.0–46.0)
Hemoglobin: 13.8 g/dL (ref 12.0–15.0)
Immature Granulocytes: 0 %
Lymphocytes Relative: 27 %
Lymphs Abs: 0.9 10*3/uL (ref 0.7–4.0)
MCH: 36.8 pg — ABNORMAL HIGH (ref 26.0–34.0)
MCHC: 34.8 g/dL (ref 30.0–36.0)
MCV: 105.6 fL — ABNORMAL HIGH (ref 80.0–100.0)
Monocytes Absolute: 0.4 10*3/uL (ref 0.1–1.0)
Monocytes Relative: 12 %
Neutro Abs: 1.8 10*3/uL (ref 1.7–7.7)
Neutrophils Relative %: 54 %
Platelet Count: 144 10*3/uL — ABNORMAL LOW (ref 150–400)
RBC: 3.75 MIL/uL — ABNORMAL LOW (ref 3.87–5.11)
RDW: 12.7 % (ref 11.5–15.5)
WBC Count: 3.3 10*3/uL — ABNORMAL LOW (ref 4.0–10.5)
nRBC: 0 % (ref 0.0–0.2)

## 2023-05-15 LAB — CMP (CANCER CENTER ONLY)
ALT: 29 U/L (ref 0–44)
AST: 29 U/L (ref 15–41)
Albumin: 4 g/dL (ref 3.5–5.0)
Alkaline Phosphatase: 75 U/L (ref 38–126)
Anion gap: 5 (ref 5–15)
BUN: 18 mg/dL (ref 8–23)
CO2: 24 mmol/L (ref 22–32)
Calcium: 9.2 mg/dL (ref 8.9–10.3)
Chloride: 110 mmol/L (ref 98–111)
Creatinine: 1.02 mg/dL — ABNORMAL HIGH (ref 0.44–1.00)
GFR, Estimated: 59 mL/min — ABNORMAL LOW (ref >60)
Glucose, Bld: 90 mg/dL (ref 70–99)
Potassium: 4.3 mmol/L (ref 3.5–5.1)
Sodium: 139 mmol/L (ref 135–145)
Total Bilirubin: 0.5 mg/dL (ref 0.0–1.2)
Total Protein: 6.5 g/dL (ref 6.5–8.1)

## 2023-05-15 MED ORDER — SODIUM CHLORIDE 0.9 % IV SOLN: Freq: Once | INTRAVENOUS | Status: DC

## 2023-05-15 MED ORDER — ZOLEDRONIC ACID 4 MG/5ML IV CONC
3.0000 mg | Freq: Once | INTRAVENOUS | Status: AC
  Administered 2023-05-15: 3 mg via INTRAVENOUS
  Filled 2023-05-15: qty 3.75

## 2023-05-15 NOTE — Assessment & Plan Note (Signed)
 Right breast biopsy on 09/25/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  Patient moved from [place] because she could not tolerate the heat.   Current treatment: Abemaciclib, anastrozole and Zometa    Toxicities: 1.  Elevated LFTs: Currently on 100 mg p.o. twice daily.  She is tolerating it extremely well with normal LFTs. 2. leukopenia/neutropenia: ANC 2.2 3.  Thrombocytopenia: Platelets 149 today   Bone metastases: On Zometa every 3 months Zometa toxicity: She developed chills aches and pains for 2 days after Zometa infusion.  Therefore we reduced the dosage of Zometa to 3 mg    Hypothyroidism: on thyroid medication. Depression:Starting her on Lexapro   Continue current treatment 11/28/2021: PET CT scan: No significant interval change in bone mets. 05/17/2022: PET CT scan: No evidence of progressive metastatic disease.  Similar bone metastases without hypermetabolism, hepatic steatosis 11/23/2022: PET/CT: Stable disease, no metabolically active recurrent or progressive disease, stable bone mets, hepatic steatosis 05/10/2023: PET/CT:   RTC in 3 months with labs and Zometa

## 2023-05-15 NOTE — Progress Notes (Signed)
 Patient Care Team: Alveria Apley, NP as PCP - General (Family Medicine) Serena Croissant, MD as Consulting Physician (Hematology and Oncology) [facility], [facility]  DIAGNOSIS:  Encounter Diagnosis  Name Primary?   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    05/22/2019 PET scan   PET scan on 05/22/19 showed resolution of the multiple osseous metastatic lesions, right chest wall mass, and bilateral axillary lymphadenopathy.     Anti-estrogen oral therapy   Abemaciclib, Anastrozole and Zometa.  (Moved from [place])   09/10/2019 Genetic Testing   Negative genetic testing:  No pathogenic variants detected on the [facility] Common Hereditary Cancers panel. The report date is 09/10/2019.   The Common Hereditary Cancers Panel offered by [facility] includes sequencing and/or deletion duplication testing of the following 48 genes: APC, ATM, AXIN2, BARD1, BMPR1A, BRCA1, BRCA2, BRIP1, CDH1, CDK4, CDKN2A (p14ARF), CDKN2A (p16INK4a), CHEK2, CTNNA1, DICER1, EPCAM (Deletion/duplication testing only), GREM1 (promoter region deletion/duplication testing only), KIT, MEN1, MLH1, MSH2, MSH3, MSH6, MUTYH, NBN, NF1, NTHL1, PALB2, PDGFRA, PMS2, POLD1, POLE, PTEN, RAD50, RAD51C, RAD51D, RNF43, SDHB, SDHC, SDHD, SMAD4, SMARCA4. STK11, TP53, TSC1, TSC2, and VHL.  The following genes were evaluated for sequence changes only: SDHA and HOXB13 c.251G>A variant only.    11/24/2022 Cancer Staging   Staging form: Breast, AJCC 8th Edition - Clinical: Stage IV (pM1) - Signed by Loa Socks, NP on 11/24/2022     CHIEF COMPLIANT: Follow-up to review the results of PET CT scan and to receive Zometa  HISTORY OF PRESENT  ILLNESS:  History of Present Illness The patient, with a history of metastatic breast cancer, presents for a follow-up visit to discuss the results of a recent PET scan. She reports no new symptoms or concerns. The patient mentions a recent dental issue involving a lost crown, which will require a costly bridge replacement. She also expresses a desire to lose weight, as she has gained some over the winter months. 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     ALLERGIES:  is allergic to aspirin.  MEDICATIONS:  Current Outpatient Medications  Medication Sig Dispense Refill   anastrozole (ARIMIDEX) 1 MG tablet TAKE 1 TABLET BY MOUTH EVERY DAY 90 tablet 3   cyclobenzaprine (FLEXERIL) 10 MG tablet TAKE 1 TABLET BY MOUTH THREE TIMES A DAY AS NEEDED FOR MUSCLE SPASMS 21 tablet 1   levothyroxine (SYNTHROID) 50 MCG tablet TAKE 1 TABLET BY MOUTH DAILY BEFORE BREAKFAST 90 tablet 0   VERZENIO 100 MG tablet TAKE 1 TABLET BY MOUTH TWO TIMES DAILY. 56 tablet 0   gabapentin (NEURONTIN) 100 MG capsule Take 1 capsule (100 mg total) by mouth 2 (two) times daily. 60 capsule 0   Turmeric (QC TUMERIC COMPLEX) 500 MG CAPS Take 1 tablet by mouth daily. (Patient not taking: Reported on 05/15/2023)     No current facility-administered medications for this visit.   Facility-Administered Medications Ordered in Other Visits  Medication Dose Route Frequency Provider Last Rate Last Admin   0.9 %  sodium chloride infusion   Intravenous Once Serena Croissant, MD       zoledronic acid (ZOMETA) 3 mg in sodium chloride 0.9 % 100 mL IVPB  3 mg Intravenous Once Lilee Aldea,  Mikey College, MD        PHYSICAL EXAMINATION: ECOG PERFORMANCE STATUS: 1 - Symptomatic but completely ambulatory  Vitals:   05/15/23 0819  BP: (!) 141/90  Pulse: 86  Resp: 18  Temp: 98.3 F (36.8 C)  SpO2: 100%   Filed Weights   05/15/23 0819  Weight: 151 lb (68.5 kg)    Physical Exam MEASUREMENTS:  Weight- 151.  (exam performed in the presence of a chaperone)  LABORATORY DATA:  I have reviewed the data as listed    Latest Ref Rng & Units 05/15/2023    7:43 AM 02/20/2023    7:43 AM 11/28/2022    7:43 AM  CMP  Glucose 70 - 99 mg/dL 90  91  95   BUN 8 - 23 mg/dL 18  18  15    Creatinine 0.44 - 1.00 mg/dL 4.09  8.11  9.14   Sodium 135 - 145 mmol/L 139  140  139   Potassium 3.5 - 5.1 mmol/L 4.3  4.5  4.1   Chloride 98 - 111 mmol/L 110  107  109   CO2 22 - 32 mmol/L 24  28  24    Calcium 8.9 - 10.3 mg/dL 9.2  9.3  9.4   Total Protein 6.5 - 8.1 g/dL 6.5  6.6  6.5   Total Bilirubin 0.0 - 1.2 mg/dL 0.5  0.7  0.6   Alkaline Phos 38 - 126 U/L 75  83  76   AST 15 - 41 U/L 29  25  31    ALT 0 - 44 U/L 29  30  36     Lab Results  Component Value Date   WBC 3.3 (L) 05/15/2023   HGB 13.8 05/15/2023   HCT 39.6 05/15/2023   MCV 105.6 (H) 05/15/2023   PLT 144 (L) 05/15/2023   NEUTROABS 1.8 05/15/2023    ASSESSMENT & PLAN:  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  Patient moved from [place] because she could not tolerate the heat.   Current treatment: Abemaciclib, anastrozole and Zometa    Toxicities: 1.  Elevated LFTs: Currently on 100 mg p.o. twice daily.  She is tolerating it extremely well with normal LFTs. 2. leukopenia/neutropenia: ANC 2.2 3.  Thrombocytopenia: Platelets 149 today   Bone metastases: On Zometa every 3 months Zometa toxicity: She developed chills aches and pains for 2 days after Zometa infusion.  Therefore we reduced the dosage of Zometa to 3 mg    Hypothyroidism: on thyroid medication. Depression:Starting her on Lexapro   Continue current treatment 11/28/2021: PET CT scan: No significant interval change in bone mets. 05/17/2022: PET CT scan: No evidence of progressive metastatic disease.  Similar bone metastases without hypermetabolism,  hepatic steatosis 11/23/2022: PET/CT: Stable disease, no metabolically active recurrent or progressive disease, stable bone mets, hepatic steatosis 05/10/2023: PET/CT: To my review there appears to be stable disease these   RTC in 6 months with labs and Zometa and to review PET CT scan Assessment & Plan Weight gain Weight increased to 151 pounds from usual 135 pounds due to decreased activity and dietary indiscretions. She desires weight loss. - Encourage increased physical activity. - Discuss dietary modifications to support weight loss.      Orders Placed This Encounter  Procedures   NM PET Image Restag (PS) Skull Base To Thigh    Standing Status:   Future    Expected Date:  11/14/2023    Expiration Date:   05/14/2024    If indicated for the ordered procedure, I authorize the administration of a radiopharmaceutical per Radiology protocol:   Yes    Preferred imaging location?:   [facility]    Release to patient:   Immediate   The patient has a good understanding of the overall plan. she agrees with it. she will call with any problems that may develop before the next visit here. Total time spent: 30 mins including face to face time and time spent for planning, charting and co-ordination of care   Tamsen Meek, MD 05/15/23

## 2023-05-16 ENCOUNTER — Telehealth: Payer: Self-pay | Admitting: Hematology and Oncology

## 2023-05-16 LAB — THYROID PANEL WITH TSH
Free Thyroxine Index: 1.6 (ref 1.2–4.9)
T3 Uptake Ratio: 23 % — ABNORMAL LOW (ref 24–39)
T4, Total: 7.1 ug/dL (ref 4.5–12.0)
TSH: 1.85 u[IU]/mL (ref 0.450–4.500)

## 2023-05-16 NOTE — Telephone Encounter (Signed)
 Spoke with pt about scheduled appt date and time. She will call back to confirm

## 2023-05-21 ENCOUNTER — Other Ambulatory Visit: Payer: Self-pay | Admitting: Hematology and Oncology

## 2023-05-21 DIAGNOSIS — C50919 Malignant neoplasm of unspecified site of unspecified female breast: Secondary | ICD-10-CM

## 2023-05-26 ENCOUNTER — Other Ambulatory Visit: Payer: Self-pay | Admitting: Family Medicine

## 2023-05-26 DIAGNOSIS — E039 Hypothyroidism, unspecified: Secondary | ICD-10-CM

## 2023-06-15 ENCOUNTER — Other Ambulatory Visit: Payer: Self-pay | Admitting: Hematology and Oncology

## 2023-06-15 DIAGNOSIS — C50919 Malignant neoplasm of unspecified site of unspecified female breast: Secondary | ICD-10-CM

## 2023-07-10 ENCOUNTER — Other Ambulatory Visit: Payer: Self-pay | Admitting: Hematology and Oncology

## 2023-07-10 DIAGNOSIS — C50919 Malignant neoplasm of unspecified site of unspecified female breast: Secondary | ICD-10-CM

## 2023-08-03 ENCOUNTER — Other Ambulatory Visit: Payer: Self-pay | Admitting: Hematology and Oncology

## 2023-08-03 DIAGNOSIS — C50919 Malignant neoplasm of unspecified site of unspecified female breast: Secondary | ICD-10-CM

## 2023-08-15 ENCOUNTER — Other Ambulatory Visit: Payer: Self-pay | Admitting: Family Medicine

## 2023-08-15 DIAGNOSIS — M62830 Muscle spasm of back: Secondary | ICD-10-CM

## 2023-08-15 MED ORDER — CYCLOBENZAPRINE HCL 10 MG PO TABS: 10.0000 mg | ORAL_TABLET | Freq: Three times a day (TID) | ORAL | 1 refills | Status: AC

## 2023-08-15 NOTE — Telephone Encounter (Signed)
 Copied from CRM [id number]. Topic: Clinical - Medication Refill >> Aug 15, 2023  8:15 AM Berneda FALCON wrote: Medication: cyclobenzaprine  (FLEXERIL ) 10 MG tablet  Has the patient contacted their pharmacy? No, she was told to call us . (Agent: If no, request that the patient contact the pharmacy for the refill. If patient does not wish to contact the pharmacy document the reason why and proceed with request.) (Agent: If yes, when and what did the pharmacy advise?)  This is the patient's preferred pharmacy:  [id number] - [place], [place] - [address] [place] [address] Phone: [phone] Fax: [phone]  Is this the correct pharmacy for this prescription? Yes If no, delete pharmacy and type the correct one.   Has the prescription been filled recently? No  Is the patient out of the medication? Yes  Has the patient been seen for an appointment in the last year OR does the patient have an upcoming appointment? Yes  Can we respond through MyChart? No, please call [phone]  Agent: Please be advised that Rx refills may take up to 3 business days. We ask that you follow-up with your pharmacy.

## 2023-08-24 ENCOUNTER — Other Ambulatory Visit: Payer: Self-pay | Admitting: Family Medicine

## 2023-08-24 DIAGNOSIS — E039 Hypothyroidism, unspecified: Secondary | ICD-10-CM

## 2023-08-28 ENCOUNTER — Other Ambulatory Visit: Payer: Self-pay | Admitting: Hematology and Oncology

## 2023-08-28 DIAGNOSIS — C50919 Malignant neoplasm of unspecified site of unspecified female breast: Secondary | ICD-10-CM

## 2023-09-25 ENCOUNTER — Other Ambulatory Visit: Payer: Self-pay | Admitting: Hematology and Oncology

## 2023-09-25 DIAGNOSIS — C50919 Malignant neoplasm of unspecified site of unspecified female breast: Secondary | ICD-10-CM

## 2023-10-04 ENCOUNTER — Other Ambulatory Visit: Payer: Self-pay | Admitting: Hematology and Oncology

## 2023-11-15 ENCOUNTER — Encounter (HOSPITAL_COMMUNITY)
Admission: RE | Admit: 2023-11-15 | Discharge: 2023-11-15 | Disposition: A | Discharge status: Home / Self Care | Source: Ambulatory Visit | Attending: Hematology and Oncology | Admitting: Hematology and Oncology

## 2023-11-15 DIAGNOSIS — C50919 Malignant neoplasm of unspecified site of unspecified female breast: Secondary | ICD-10-CM | POA: Diagnosis not present

## 2023-11-15 DIAGNOSIS — C7951 Secondary malignant neoplasm of bone: Secondary | ICD-10-CM | POA: Insufficient documentation

## 2023-11-15 LAB — GLUCOSE, CAPILLARY: Glucose-Capillary: 97 mg/dL (ref 70–99)

## 2023-11-15 MED ORDER — FLUDEOXYGLUCOSE F - 18 (FDG) INJECTION
7.5500 | Freq: Once | INTRAVENOUS | Status: AC | PRN
  Administered 2023-11-15: 7.54 via INTRAVENOUS

## 2023-11-16 ENCOUNTER — Other Ambulatory Visit: Payer: Self-pay | Admitting: Hematology and Oncology

## 2023-11-16 DIAGNOSIS — C50919 Malignant neoplasm of unspecified site of unspecified female breast: Secondary | ICD-10-CM

## 2023-11-19 ENCOUNTER — Other Ambulatory Visit: Payer: Self-pay

## 2023-11-19 DIAGNOSIS — C50919 Malignant neoplasm of unspecified site of unspecified female breast: Secondary | ICD-10-CM

## 2023-11-20 ENCOUNTER — Inpatient Hospital Stay

## 2023-11-20 ENCOUNTER — Encounter: Payer: Self-pay | Admitting: *Deleted

## 2023-11-20 ENCOUNTER — Inpatient Hospital Stay: Admitting: Hematology and Oncology

## 2023-11-20 ENCOUNTER — Inpatient Hospital Stay: Attending: Hematology and Oncology

## 2023-11-20 VITALS — BP 142/75 | HR 73 | Temp 97.9°F | Resp 17 | Wt 143.4 lb

## 2023-11-20 DIAGNOSIS — C50911 Malignant neoplasm of unspecified site of right female breast: Secondary | ICD-10-CM | POA: Insufficient documentation

## 2023-11-20 DIAGNOSIS — D701 Agranulocytosis secondary to cancer chemotherapy: Secondary | ICD-10-CM | POA: Diagnosis not present

## 2023-11-20 DIAGNOSIS — E039 Hypothyroidism, unspecified: Secondary | ICD-10-CM | POA: Insufficient documentation

## 2023-11-20 DIAGNOSIS — F32A Depression, unspecified: Secondary | ICD-10-CM | POA: Insufficient documentation

## 2023-11-20 DIAGNOSIS — C50919 Malignant neoplasm of unspecified site of unspecified female breast: Secondary | ICD-10-CM

## 2023-11-20 DIAGNOSIS — C7951 Secondary malignant neoplasm of bone: Secondary | ICD-10-CM | POA: Diagnosis not present

## 2023-11-20 DIAGNOSIS — R7989 Other specified abnormal findings of blood chemistry: Secondary | ICD-10-CM | POA: Insufficient documentation

## 2023-11-20 DIAGNOSIS — Z170 Estrogen receptor positive status [ER+]: Secondary | ICD-10-CM | POA: Diagnosis not present

## 2023-11-20 DIAGNOSIS — D6959 Other secondary thrombocytopenia: Secondary | ICD-10-CM | POA: Insufficient documentation

## 2023-11-20 LAB — CMP (CANCER CENTER ONLY)
ALT: 33 U/L (ref 0–44)
AST: 26 U/L (ref 15–41)
Albumin: 4.1 g/dL (ref 3.5–5.0)
Alkaline Phosphatase: 87 U/L (ref 38–126)
Anion gap: 5 (ref 5–15)
BUN: 17 mg/dL (ref 8–23)
CO2: 26 mmol/L (ref 22–32)
Calcium: 10 mg/dL (ref 8.9–10.3)
Chloride: 109 mmol/L (ref 98–111)
Creatinine: 1.16 mg/dL — ABNORMAL HIGH (ref 0.44–1.00)
GFR, Estimated: 50 mL/min — ABNORMAL LOW (ref >60)
Glucose, Bld: 95 mg/dL (ref 70–99)
Potassium: 4.4 mmol/L (ref 3.5–5.1)
Sodium: 140 mmol/L (ref 135–145)
Total Bilirubin: 0.6 mg/dL (ref 0.0–1.2)
Total Protein: 6.6 g/dL (ref 6.5–8.1)

## 2023-11-20 LAB — CBC WITH DIFFERENTIAL (CANCER CENTER ONLY)
Abs Immature Granulocytes: 0.02 K/uL (ref 0.00–0.07)
Basophils Absolute: 0.1 K/uL (ref 0.0–0.1)
Basophils Relative: 2 %
Eosinophils Absolute: 0.1 K/uL (ref 0.0–0.5)
Eosinophils Relative: 4 %
HCT: 39.9 % (ref 36.0–46.0)
Hemoglobin: 13.9 g/dL (ref 12.0–15.0)
Immature Granulocytes: 1 %
Lymphocytes Relative: 27 %
Lymphs Abs: 1 K/uL (ref 0.7–4.0)
MCH: 37 pg — ABNORMAL HIGH (ref 26.0–34.0)
MCHC: 34.8 g/dL (ref 30.0–36.0)
MCV: 106.1 fL — ABNORMAL HIGH (ref 80.0–100.0)
Monocytes Absolute: 0.4 K/uL (ref 0.1–1.0)
Monocytes Relative: 10 %
Neutro Abs: 2 K/uL (ref 1.7–7.7)
Neutrophils Relative %: 56 %
Platelet Count: 145 K/uL — ABNORMAL LOW (ref 150–400)
RBC: 3.76 MIL/uL — ABNORMAL LOW (ref 3.87–5.11)
RDW: 12.7 % (ref 11.5–15.5)
WBC Count: 3.6 K/uL — ABNORMAL LOW (ref 4.0–10.5)
nRBC: 0 % (ref 0.0–0.2)

## 2023-11-20 MED ORDER — SODIUM CHLORIDE 0.9 % IV SOLN: Freq: Once | INTRAVENOUS | Status: DC

## 2023-11-20 MED ORDER — ZOLEDRONIC ACID 4 MG/5ML IV CONC
3.0000 mg | Freq: Once | INTRAVENOUS | Status: DC
  Filled 2023-11-20: qty 3.75

## 2023-11-20 NOTE — Progress Notes (Signed)
 Pt was here for zometa .  After asking further about dental. Pt stated I had 4 crowns done  Received ok to treat from Dr Gudena.  After 2 attempts at PIV, pt again stated That's it, Im done and proceeded to walk out.  Dr Odean and Larraine, RN notified of patient departure.

## 2023-11-20 NOTE — Progress Notes (Signed)
 Patient Care Team: Billy Philippe SAUNDERS, NP as PCP - General (Family Medicine) Odean Potts, MD as Consulting Physician (Hematology and Oncology) [facility], [facility]  DIAGNOSIS:  Encounter Diagnosis  Name Primary?   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    05/22/2019 PET scan   PET scan on 05/22/19 showed resolution of the multiple osseous metastatic lesions, right chest wall mass, and bilateral axillary lymphadenopathy.     Anti-estrogen oral therapy   Abemaciclib , Anastrozole  and Zometa .  (Moved from [place] )   09/10/2019 Genetic Testing   Negative genetic testing:  No pathogenic variants detected on the [facility] Common Hereditary Cancers panel. The report date is 09/10/2019.   The Common Hereditary Cancers Panel offered by [facility] includes sequencing and/or deletion duplication testing of the following 48 genes: APC, ATM, AXIN2, BARD1, BMPR1A, BRCA1, BRCA2, BRIP1, CDH1, CDK4, CDKN2A (p14ARF), CDKN2A (p16INK4a), CHEK2, CTNNA1, DICER1, EPCAM (Deletion/duplication testing only), GREM1 (promoter region deletion/duplication testing only), KIT, MEN1, MLH1, MSH2, MSH3, MSH6, MUTYH, NBN, NF1, NTHL1, PALB2, PDGFRA, PMS2, POLD1, POLE, PTEN, RAD50, RAD51C, RAD51D, RNF43, SDHB, SDHC, SDHD, SMAD4, SMARCA4. STK11, TP53, TSC1, TSC2, and VHL.  The following genes were evaluated for sequence changes only: SDHA and HOXB13 c.251G>A variant only.    11/24/2022 Cancer Staging   Staging form: Breast, AJCC 8th Edition - Clinical: Stage IV (pM1) - Signed by Crawford Morna Pickle, NP on 11/24/2022     CHIEF COMPLIANT: Follow-up of metastatic breast cancer  HISTORY OF PRESENT ILLNESS:   History of Present  Illness Norma Murray is a 73 year old female who presents for follow-up after a PET scan.  The PET scan shows a slight increase in metabolic activity at the L4 vertebra compared to the previous scan six months ago. She experiences neuropathy in her feet but has no new pain or discomfort. There are no new symptoms in her breast.  She receives Zometa  every six months for bone health and has not been administered Xgeva or similar injections.  Her daughter-in-law's recent passing from CIDP complications has been a source of stress.     ALLERGIES:  is allergic to aspirin.  MEDICATIONS:  Current Outpatient Medications  Medication Sig Dispense Refill   anastrozole  (ARIMIDEX ) 1 MG tablet TAKE 1 TABLET BY MOUTH EVERY DAY 90 tablet 3   cyclobenzaprine  (FLEXERIL ) 10 MG tablet Take 1 tablet (10 mg total) by mouth 3 (three) times daily. 21 tablet 1   levothyroxine  (SYNTHROID ) 50 MCG tablet TAKE 1 TABLET BY MOUTH DAILY BEFORE BREAKFAST 90 tablet 0   VERZENIO  100 MG tablet TAKE 1 TABLET BY MOUTH TWICE DAILY 56 tablet 0   gabapentin  (NEURONTIN ) 100 MG capsule Take 1 capsule (100 mg total) by mouth 2 (two) times daily. (Patient not taking: Reported on 11/20/2023) 60 capsule 0   Turmeric (QC TUMERIC COMPLEX) 500 MG CAPS Take 1 tablet by mouth daily. (Patient not taking: Reported on 11/20/2023)     No current facility-administered medications for this visit.    PHYSICAL EXAMINATION: ECOG PERFORMANCE STATUS: 1 - Symptomatic but completely ambulatory  Vitals:   11/20/23 0914  BP: (!) 142/75  Pulse: 73  Resp: 17  Temp: 97.9 F (36.6 C)  SpO2: 100%   Filed Weights   11/20/23 0914  Weight:  143 lb 6.4 oz (65 kg)      LABORATORY DATA:  I have reviewed the data as listed    Latest Ref Rng & Units 11/20/2023    8:52 AM 05/15/2023    7:43 AM 02/20/2023    7:43 AM  CMP  Glucose 70 - 99 mg/dL 95  90  91   BUN 8 - 23 mg/dL 17  18  18    Creatinine 0.44 - 1.00 mg/dL 8.83  8.97  8.79    Sodium 135 - 145 mmol/L 140  139  140   Potassium 3.5 - 5.1 mmol/L 4.4  4.3  4.5   Chloride 98 - 111 mmol/L 109  110  107   CO2 22 - 32 mmol/L 26  24  28    Calcium 8.9 - 10.3 mg/dL 89.9  9.2  9.3   Total Protein 6.5 - 8.1 g/dL 6.6  6.5  6.6   Total Bilirubin 0.0 - 1.2 mg/dL 0.6  0.5  0.7   Alkaline Phos 38 - 126 U/L 87  75  83   AST 15 - 41 U/L 26  29  25    ALT 0 - 44 U/L 33  29  30     Lab Results  Component Value Date   WBC 3.6 (L) 11/20/2023   HGB 13.9 11/20/2023   HCT 39.9 11/20/2023   MCV 106.1 (H) 11/20/2023   PLT 145 (L) 11/20/2023   NEUTROABS 2.0 11/20/2023    ASSESSMENT & PLAN:  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  Patient moved from [place]  because she could not tolerate the heat.   Current treatment: Abemaciclib , anastrozole  and Zometa     Toxicities: 1.  Elevated LFTs: Currently on 100 mg p.o. twice daily.  She is tolerating it extremely well with normal LFTs. 2. leukopenia/neutropenia: ANC 2.2 3.  Thrombocytopenia: Platelets 149 today   Bone metastases: On Zometa  every 3 months Zometa  toxicity: She developed chills aches and pains for 2 days after Zometa  infusion.  Therefore we reduced the dosage of Zometa  to 3 mg    Hypothyroidism: on thyroid  medication. Depression:Starting her on Lexapro    11/28/2021: PET CT scan: No significant interval change in bone mets. 05/17/2022: PET CT scan: No evidence of progressive metastatic disease.  Similar bone metastases without hypermetabolism, hepatic steatosis 11/23/2022: PET/CT: Stable disease, no metabolically active recurrent or progressive disease, stable bone mets, hepatic steatosis 05/10/2023: PET/CT: stable disease 11/15/2023: PET/CT interval mildly increased metabolic activity within the left L4 pedicle SUV 7.5 nonspecific (facet disease versus metastatic disease) stable bone  metastases  Radiology review: I discussed the PET CT scan findings with the patient.  I recommended a short-term follow-up   bone scan in 3 months.   Follow-up in 3 months after the bone scan. ------------------------------------- Assessment and Plan Assessment & Plan Metastatic breast cancer with bone involvement PET scan indicated increased metabolic activity in left L4 pedicle, likely due to facet disease or cancer. No new hypermetabolic activity in breast or other bones. No significant pain, suggesting no aggressive disease progression. MRI deemed unnecessary. - Order bone scan in 3 months to monitor L4 activity. - Continue Zometa  infusion every 6 months.        No orders of the defined types were placed in this encounter.  The patient has a good understanding of the overall plan. she agrees with it. she will call with any problems that  may develop before the next visit here.  I personally spent a total of 30 minutes in the care of the patient today including preparing to see the patient, getting/reviewing separately obtained history, performing a medically appropriate exam/evaluation, counseling and educating, placing orders, referring and communicating with other health care professionals, documenting clinical information in the EHR, independently interpreting results, communicating results, and coordinating care.   Viinay K Brennan Karam, MD 11/20/23

## 2023-11-20 NOTE — Assessment & Plan Note (Signed)
 Right breast biopsy on 09/25/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  Patient moved from [place]  because she could not tolerate the heat.   Current treatment: Abemaciclib , anastrozole  and Zometa     Toxicities: 1.  Elevated LFTs: Currently on 100 mg p.o. twice daily.  She is tolerating it extremely well with normal LFTs. 2. leukopenia/neutropenia: ANC 2.2 3.  Thrombocytopenia: Platelets 149 today   Bone metastases: On Zometa  every 3 months Zometa  toxicity: She developed chills aches and pains for 2 days after Zometa  infusion.  Therefore we reduced the dosage of Zometa  to 3 mg    Hypothyroidism: on thyroid  medication. Depression:Starting her on Lexapro    11/28/2021: PET CT scan: No significant interval change in bone mets. 05/17/2022: PET CT scan: No evidence of progressive metastatic disease.  Similar bone metastases without hypermetabolism, hepatic steatosis 11/23/2022: PET/CT: Stable disease, no metabolically active recurrent or progressive disease, stable bone mets, hepatic steatosis 05/10/2023: PET/CT: stable disease 11/15/2023: PET/CT interval mildly increased metabolic activity within the left L4 pedicle SUV 7.5 nonspecific (facet disease versus metastatic disease) stable bone metastases  Radiology review: I discussed the PET CT scan findings with the patient.  I recommended a short-term follow-up CT CAP and a bone scan in 3 months.   RTC in 6 months with labs and Zometa  and to review PET CT scan

## 2023-11-20 NOTE — Progress Notes (Signed)
 Pt states she had tooth crowns replaced end of August of 2025.  Pt states there was no drilling into her jaw bone or root canals done, just a crown replacement.  Per MD okay to proceed with Zometa  infusion today.

## 2023-11-21 ENCOUNTER — Other Ambulatory Visit: Payer: Self-pay | Admitting: Family Medicine

## 2023-11-21 DIAGNOSIS — E039 Hypothyroidism, unspecified: Secondary | ICD-10-CM

## 2023-11-21 LAB — THYROID PANEL WITH TSH
Free Thyroxine Index: 1.8 (ref 1.2–4.9)
T3 Uptake Ratio: 26 % (ref 24–39)
T4, Total: 6.9 ug/dL (ref 4.5–12.0)
TSH: 1.71 u[IU]/mL (ref 0.450–4.500)

## 2023-11-21 NOTE — Telephone Encounter (Signed)
 Copied from CRM [id number]. Topic: Clinical - Medication Refill >> Nov 21, 2023 11:25 AM Tanazia G wrote: Medication:  levothyroxine  (SYNTHROID ) 50 MCG tablet   Has the patient contacted their pharmacy? Yes (Agent: If no, request that the patient contact the pharmacy for the refill. If patient does not wish to contact the pharmacy document the reason why and proceed with request.) (Agent: If yes, when and what did the pharmacy advise?)  This is the patient's preferred pharmacy:  [id number] - [place], [place] - [address] [place] [address] Phone: [phone] Fax: [phone]  Is this the correct pharmacy for this prescription? Yes If no, delete pharmacy and type the correct one.   Has the prescription been filled recently? Yes  Is the patient out of the medication? Yes  Has the patient been seen for an appointment in the last year OR does the patient have an upcoming appointment? Yes  Can we respond through MyChart? Yes  Agent: Please be advised that Rx refills may take up to 3 business days. We ask that you follow-up with your pharmacy.

## 2023-11-24 IMAGING — CT NM PET TUM IMG RESTAG (PS) SKULL BASE T - THIGH
1 of 7 series · 1 of 25 positions shown · non-contrast
Comparison: PET-CT 11/08/2020

CLINICAL DATA: Subsequent treatment strategy for invasive stage IV
breast carcinoma.

EXAM:
NUCLEAR MEDICINE PET SKULL BASE TO THIGH
TECHNIQUE: 8.0 mCi F-18 FDG was injected intravenously. Full-ring PET imaging
was performed from the skull base to thigh after the radiotracer. CT
data was obtained and used for attenuation correction and anatomic
localization.
Fasting blood glucose: 102 mg/dl

[Series 4: ct sk_thigh 5.0 br38 · axial · 5.0mm · 0.98mm/px · 1 of 217 slices shown]
[im 217/217  brain]
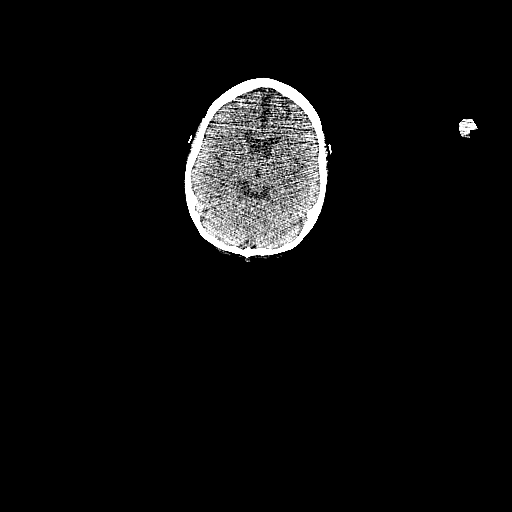

[1 of 25 positions shown; findings below may reference images not displayed]

FINDINGS: Mediastinal blood pool activity: SUV max

Liver activity: SUV max NA

NECK: No hypermetabolic lymph nodes in the neck. Unchanged diffuse
hypermetabolic activity in the thyroid gland.

Incidental CT findings: none

CHEST: No hypermetabolic mediastinal or hilar nodes. No suspicious
pulmonary nodules on the CT scan.

Incidental CT findings: none

ABDOMEN/PELVIS: No abnormal hypermetabolic activity within the
liver, pancreas, adrenal glands, or spleen. No hypermetabolic lymph
nodes in the abdomen or pelvis.

Incidental CT findings: Uterus and adnexa unremarkable.

SKELETON: There is diffuse sclerotic skeletal metastasis. No
associated hypermetabolic lesions suggest active metastasis. No
change from comparison PET-CT scan. Again demonstrated
hypermetabolic activity at the L5-S1 facet joints.

Incidental CT findings: none
IMPRESSION: 1. No evidence of breast cancer recurrence. No evidence of active
breast cancer.
2. Diffuse sclerotic skeletal metastasis throughout the skeleton
without metabolic activity most consistent treated breast cancer
metastasis.

## 2023-11-26 ENCOUNTER — Telehealth: Payer: Self-pay

## 2023-11-26 ENCOUNTER — Inpatient Hospital Stay

## 2023-11-26 VITALS — BP 130/76 | HR 75 | Temp 97.7°F | Resp 16

## 2023-11-26 DIAGNOSIS — C50919 Malignant neoplasm of unspecified site of unspecified female breast: Secondary | ICD-10-CM

## 2023-11-26 DIAGNOSIS — R7989 Other specified abnormal findings of blood chemistry: Secondary | ICD-10-CM | POA: Diagnosis not present

## 2023-11-26 DIAGNOSIS — Z170 Estrogen receptor positive status [ER+]: Secondary | ICD-10-CM | POA: Diagnosis not present

## 2023-11-26 DIAGNOSIS — D6959 Other secondary thrombocytopenia: Secondary | ICD-10-CM | POA: Diagnosis not present

## 2023-11-26 DIAGNOSIS — C7951 Secondary malignant neoplasm of bone: Secondary | ICD-10-CM | POA: Diagnosis not present

## 2023-11-26 DIAGNOSIS — E039 Hypothyroidism, unspecified: Secondary | ICD-10-CM | POA: Diagnosis not present

## 2023-11-26 DIAGNOSIS — C50911 Malignant neoplasm of unspecified site of right female breast: Secondary | ICD-10-CM | POA: Diagnosis not present

## 2023-11-26 DIAGNOSIS — D701 Agranulocytosis secondary to cancer chemotherapy: Secondary | ICD-10-CM | POA: Diagnosis not present

## 2023-11-26 MED ORDER — ZOLEDRONIC ACID 4 MG/5ML IV CONC
3.0000 mg | Freq: Once | INTRAVENOUS | Status: AC
  Administered 2023-11-26: 3 mg via INTRAVENOUS
  Filled 2023-11-26: qty 3.75

## 2023-11-26 NOTE — Patient Instructions (Signed)

## 2023-11-26 NOTE — Telephone Encounter (Signed)
 Copied from CRM #[id number]. Topic: Clinical - Prescription Issue >> Nov 26, 2023 10:12 AM Jasmin G wrote: Reason for CRM: Pt called to check on the status of her levothyroxine  (SYNTHROID ) 50 MCG tablet refill, I informed her that her refill was denied, pt stated that she recently got her thyroid  levels checked if that's the reason why her med refill was denied. Please call pt back at [phone] to discuss.

## 2023-11-28 ENCOUNTER — Other Ambulatory Visit: Payer: Self-pay | Admitting: *Deleted

## 2023-11-28 ENCOUNTER — Telehealth: Payer: Self-pay | Admitting: *Deleted

## 2023-11-28 ENCOUNTER — Telehealth: Payer: Self-pay

## 2023-11-28 DIAGNOSIS — C50919 Malignant neoplasm of unspecified site of unspecified female breast: Secondary | ICD-10-CM

## 2023-11-28 DIAGNOSIS — E039 Hypothyroidism, unspecified: Secondary | ICD-10-CM

## 2023-11-28 DIAGNOSIS — Z79811 Long term (current) use of aromatase inhibitors: Secondary | ICD-10-CM

## 2023-11-28 MED ORDER — LEVOTHYROXINE SODIUM 50 MCG PO TABS: 50.0000 ug | ORAL_TABLET | Freq: Every day | ORAL | 0 refills | Status: AC

## 2023-11-28 NOTE — Telephone Encounter (Signed)
Patient notified prescription has been sent to her pharmacy.

## 2023-11-28 NOTE — Telephone Encounter (Signed)
 Copied from CRM #[id number]. Topic: Clinical - Prescription Issue >> Nov 28, 2023  8:40 AM Eva FALCON wrote: Reason for CRM: Pt is calling in regards to thyroid  medication, still awaiting call back as to why it was denied when she has recently had her levels check. Could someone please get back to pt? I tried finding info in encounter but did not find anything.

## 2023-11-28 NOTE — Progress Notes (Signed)
 Received call from pt stating she is due for a baseline bone density scan while on AI therapy.  RN reviewed with MD and verbal orders received and placed.

## 2023-11-28 NOTE — Telephone Encounter (Signed)
 Received call from pt requesting advice from MD as to how much calcium she should be taking a day.  Per MD pt to take 1200 mg of p.o calcium daily.  Pt educated and verbalized understanding.

## 2023-11-29 ENCOUNTER — Telehealth: Payer: Self-pay | Admitting: Hematology and Oncology

## 2023-11-29 NOTE — Telephone Encounter (Signed)
 left vm for pt about scheduled appt date and times. Encouraged to call back if need to reschedule

## 2023-12-18 ENCOUNTER — Other Ambulatory Visit: Payer: Self-pay | Admitting: Hematology and Oncology

## 2023-12-18 DIAGNOSIS — C50919 Malignant neoplasm of unspecified site of unspecified female breast: Secondary | ICD-10-CM

## 2024-01-27 ENCOUNTER — Other Ambulatory Visit: Payer: Self-pay | Admitting: Hematology and Oncology

## 2024-01-27 DIAGNOSIS — C50919 Malignant neoplasm of unspecified site of unspecified female breast: Secondary | ICD-10-CM

## 2024-02-01 ENCOUNTER — Telehealth: Payer: Self-pay

## 2024-02-01 NOTE — Telephone Encounter (Addendum)
 PAP reenrollment for year 2026    Application has been submitted for Verzenio  through [employer] with both Patient and doctor signatures, along with requested documentation.  Status: Approved through 02/05/2025  Charlott Hamilton,  CPhT-Adv  she/her/hers [facility]  [facility] Pharmacy Technician [facility] Phone: [phone]  Fax: [phone] [email or web address]

## 2024-02-19 ENCOUNTER — Other Ambulatory Visit: Payer: Self-pay | Admitting: Hematology and Oncology

## 2024-02-19 DIAGNOSIS — C50919 Malignant neoplasm of unspecified site of unspecified female breast: Secondary | ICD-10-CM

## 2024-02-21 ENCOUNTER — Telehealth: Payer: Self-pay

## 2024-02-21 ENCOUNTER — Inpatient Hospital Stay: Admitting: Hematology and Oncology

## 2024-02-21 NOTE — Telephone Encounter (Signed)
 Pt called w/ concern about not being able to schedule her Bone Density test in time for her f/u w/ Gudena MD on 1/29. RN called [facility] and they said they are booked out till September so VM left w/ [facility] at [phone] requesting that they fit her in in time for her f/u w/ Gudena MD. Pt notified and encouraged to call them if she doesn't hear anything from them within a couple of days. Pt verbalized understanding.

## 2024-02-21 NOTE — Telephone Encounter (Signed)
Received call from pt

## 2024-02-23 ENCOUNTER — Other Ambulatory Visit: Payer: Self-pay | Admitting: Family Medicine

## 2024-02-23 DIAGNOSIS — E039 Hypothyroidism, unspecified: Secondary | ICD-10-CM

## 2024-02-25 NOTE — Telephone Encounter (Unsigned)
 Copied from CRM [id number]. Topic: Clinical - Medication Refill >> Feb 25, 2024 11:48 AM Zy'onna H wrote: Medication:  1. Levothyroxine  (SYNTHROID ) 50 MCG tablet  Has the patient contacted their pharmacy? Yes (Agent: If no, request that the patient contact the pharmacy for the refill. If patient does not wish to contact the pharmacy document the reason why and proceed with request.) (Agent: If yes, when and what did the pharmacy advise?)  This is the patient's preferred pharmacy:  [id number] - [place], [place] - [address] [place] [address] Phone: [phone] Fax: [phone]   Is this the correct pharmacy for this prescription? Yes If no, delete pharmacy and type the correct one.   Has the prescription been filled recently? No  Is the patient out of the medication? No  Has the patient been seen for an appointment in the last year OR does the patient have an upcoming appointment? Yes  Can we respond through MyChart? Yes  Agent: Please be advised that Rx refills may take up to 3 business days. We ask that you follow-up with your pharmacy.

## 2024-02-27 ENCOUNTER — Encounter (HOSPITAL_COMMUNITY)
Admission: RE | Admit: 2024-02-27 | Discharge: 2024-02-27 | Disposition: A | Discharge status: Home / Self Care | Source: Ambulatory Visit | Attending: Hematology and Oncology | Admitting: Hematology and Oncology

## 2024-02-27 ENCOUNTER — Telehealth: Payer: Self-pay

## 2024-02-27 DIAGNOSIS — C50919 Malignant neoplasm of unspecified site of unspecified female breast: Secondary | ICD-10-CM | POA: Insufficient documentation

## 2024-02-27 MED ORDER — TECHNETIUM TC 99M MEDRONATE IV KIT
20.0000 | PACK | Freq: Once | INTRAVENOUS | Status: AC | PRN
  Administered 2024-02-27: 21.7 via INTRAVENOUS

## 2024-02-27 NOTE — Telephone Encounter (Signed)
 Received call from pt stating that she has a bone density scan scheduled on 1/27 at [facility] and that she was told they needed an order for imaging. However, we can see the order placed in Epic for [facility]. RN left VM at [facility] radiology dept to determine what the issue is. RN attempted to call pt x1 , no answer, left VM stating that we were waiting on a response from radiology dept at [facility].

## 2024-03-04 ENCOUNTER — Ambulatory Visit (HOSPITAL_BASED_OUTPATIENT_CLINIC_OR_DEPARTMENT_OTHER)

## 2024-03-06 ENCOUNTER — Inpatient Hospital Stay: Attending: Hematology and Oncology

## 2024-03-06 ENCOUNTER — Inpatient Hospital Stay

## 2024-03-06 ENCOUNTER — Inpatient Hospital Stay: Admitting: Hematology and Oncology

## 2024-03-06 VITALS — BP 139/65 | HR 85 | Temp 98.0°F | Resp 18 | Wt 145.5 lb

## 2024-03-06 DIAGNOSIS — C50919 Malignant neoplasm of unspecified site of unspecified female breast: Secondary | ICD-10-CM | POA: Diagnosis not present

## 2024-03-06 LAB — CBC WITH DIFFERENTIAL (CANCER CENTER ONLY)
Abs Immature Granulocytes: 0.02 10*3/uL (ref 0.00–0.07)
Basophils Absolute: 0.1 10*3/uL (ref 0.0–0.1)
Basophils Relative: 2 %
Eosinophils Absolute: 0.1 10*3/uL (ref 0.0–0.5)
Eosinophils Relative: 4 %
HCT: 40.5 % (ref 36.0–46.0)
Hemoglobin: 14 g/dL (ref 12.0–15.0)
Immature Granulocytes: 1 %
Lymphocytes Relative: 27 %
Lymphs Abs: 0.9 10*3/uL (ref 0.7–4.0)
MCH: 37.5 pg — ABNORMAL HIGH (ref 26.0–34.0)
MCHC: 34.6 g/dL (ref 30.0–36.0)
MCV: 108.6 fL — ABNORMAL HIGH (ref 80.0–100.0)
Monocytes Absolute: 0.4 10*3/uL (ref 0.1–1.0)
Monocytes Relative: 13 %
Neutro Abs: 1.8 10*3/uL (ref 1.7–7.7)
Neutrophils Relative %: 53 %
Platelet Count: 140 10*3/uL — ABNORMAL LOW (ref 150–400)
RBC: 3.73 MIL/uL — ABNORMAL LOW (ref 3.87–5.11)
RDW: 13.2 % (ref 11.5–15.5)
WBC Count: 3.4 10*3/uL — ABNORMAL LOW (ref 4.0–10.5)
nRBC: 0 % (ref 0.0–0.2)

## 2024-03-06 LAB — CMP (CANCER CENTER ONLY)
ALT: 88 U/L — ABNORMAL HIGH (ref 0–44)
AST: 70 U/L — ABNORMAL HIGH (ref 15–41)
Albumin: 4.4 g/dL (ref 3.5–5.0)
Alkaline Phosphatase: 157 U/L — ABNORMAL HIGH (ref 38–126)
Anion gap: 11 (ref 5–15)
BUN: 20 mg/dL (ref 8–23)
CO2: 26 mmol/L (ref 22–32)
Calcium: 9.9 mg/dL (ref 8.9–10.3)
Chloride: 106 mmol/L (ref 98–111)
Creatinine: 1.21 mg/dL — ABNORMAL HIGH (ref 0.44–1.00)
GFR, Estimated: 47 mL/min — ABNORMAL LOW
Glucose, Bld: 98 mg/dL (ref 70–99)
Potassium: 4.5 mmol/L (ref 3.5–5.1)
Sodium: 143 mmol/L (ref 135–145)
Total Bilirubin: 0.7 mg/dL (ref 0.0–1.2)
Total Protein: 7 g/dL (ref 6.5–8.1)

## 2024-03-06 NOTE — Progress Notes (Signed)
 "  Patient Care Team: Billy Philippe SAUNDERS, NP as PCP - General (Family Medicine) Odean Potts, MD as Consulting Physician (Hematology and Oncology) [facility], [facility]  DIAGNOSIS:  Encounter Diagnosis  Name Primary?   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    05/22/2019 PET scan   PET scan on 05/22/19 showed resolution of the multiple osseous metastatic lesions, right chest wall mass, and bilateral axillary lymphadenopathy.     Anti-estrogen oral therapy   Abemaciclib , Anastrozole  and Zometa .  (Moved from [place] )   09/10/2019 Genetic Testing   Negative genetic testing:  No pathogenic variants detected on the [facility] Common Hereditary Cancers panel. The report date is 09/10/2019.   The Common Hereditary Cancers Panel offered by [facility] includes sequencing and/or deletion duplication testing of the following 48 genes: APC, ATM, AXIN2, BARD1, BMPR1A, BRCA1, BRCA2, BRIP1, CDH1, CDK4, CDKN2A (p14ARF), CDKN2A (p16INK4a), CHEK2, CTNNA1, DICER1, EPCAM (Deletion/duplication testing only), GREM1 (promoter region deletion/duplication testing only), KIT, MEN1, MLH1, MSH2, MSH3, MSH6, MUTYH, NBN, NF1, NTHL1, PALB2, PDGFRA, PMS2, POLD1, POLE, PTEN, RAD50, RAD51C, RAD51D, RNF43, SDHB, SDHC, SDHD, SMAD4, SMARCA4. STK11, TP53, TSC1, TSC2, and VHL.  The following genes were evaluated for sequence changes only: SDHA and HOXB13 c.251G>A variant only.    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  Recent bone scan showed mild uptake in the spine and ribs consistent with prior imaging, including stable asymmetric uptake in the posterior elements of L4 without increased activity compared to previous studies. She is relieved by the stable results and feels well. She denies new bone pain, neurologic symptoms, or other new cancer-related symptoms.  She continues Zometa  for bone health, with the last dose on 11/26/2023 and the schedule extended to every six months due to insurance. She is concerned about bone density loss but has no new musculoskeletal complaints.  She has had difficulty scheduling bone density scans due to limited appointment availability and needs help from family and staff to secure appointments, which has been stressful.  She reports significant psychosocial stress from caring for her ill elderly mother and her son with early-onset Alzheimer's disease, leading to fatigue and stress. She denies new physical symptoms and feels she is otherwise doing well.      ALLERGIES:  is allergic to aspirin.  MEDICATIONS:  Current Outpatient Medications  Medication Sig Dispense Refill   anastrozole  (ARIMIDEX ) 1 MG tablet TAKE 1 TABLET BY MOUTH EVERY DAY 90 tablet 3   cyclobenzaprine  (FLEXERIL ) 10 MG tablet Take 1 tablet (10 mg total) by mouth 3 (three) times daily. 21 tablet 1   levothyroxine  (SYNTHROID ) 50 MCG tablet Take 1 tablet (50 mcg total) by mouth daily before breakfast. 90 tablet 0   VERZENIO  100 MG tablet TAKE 1 TABLET BY MOUTH TWICE DAILY 56 tablet 0   No current facility-administered medications for this visit.  PHYSICAL EXAMINATION: ECOG PERFORMANCE STATUS: 1 - Symptomatic but completely ambulatory  Vitals:   03/06/24 0901  BP:  139/65  Pulse: 85  Resp: 18  Temp: 98 F (36.7 C)  SpO2: 100%   Filed Weights   03/06/24 0901  Weight: 145 lb 8 oz (66 kg)      LABORATORY DATA:  I have reviewed the data as listed    Latest Ref Rng & Units 03/06/2024    8:36 AM 11/20/2023    8:52 AM 05/15/2023    7:43 AM  CMP  Glucose 70 - 99 mg/dL 98  95  90   BUN 8 - 23 mg/dL 20  17  18    Creatinine 0.44 - 1.00 mg/dL 8.78  8.83  8.97   Sodium 135 - 145 mmol/L 143  140  139   Potassium 3.5 - 5.1 mmol/L 4.5  4.4  4.3   Chloride 98 - 111 mmol/L 106  109  110   CO2 22 - 32 mmol/L 26  26  24    Calcium 8.9 - 10.3 mg/dL 9.9  89.9  9.2   Total Protein 6.5 - 8.1 g/dL 7.0  6.6  6.5   Total Bilirubin 0.0 - 1.2 mg/dL 0.7  0.6  0.5   Alkaline Phos 38 - 126 U/L 157  87  75   AST 15 - 41 U/L 70  26  29   ALT 0 - 44 U/L 88  33  29     Lab Results  Component Value Date   WBC 3.4 (L) 03/06/2024   HGB 14.0 03/06/2024   HCT 40.5 03/06/2024   MCV 108.6 (H) 03/06/2024   PLT 140 (L) 03/06/2024   NEUTROABS 1.8 03/06/2024    ASSESSMENT & PLAN:  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  Patient moved from [place]  because she could not tolerate the heat.   Current treatment: Abemaciclib , anastrozole  and Zometa     Toxicities: 1.  Elevated LFTs: Currently on 100 mg p.o. twice daily.  She is tolerating it extremely well with normal LFTs. 2. leukopenia/neutropenia: ANC 2.2 3.  Thrombocytopenia: Platelets 149 today   Bone metastases: On Zometa  every 3 months Zometa  toxicity: She developed chills aches and pains for 2 days after Zometa  infusion.  Therefore we reduced the dosage of Zometa  to 3 mg    Hypothyroidism: on thyroid  medication. Depression:Starting her on Lexapro    11/28/2021: PET CT scan: No significant interval change in bone mets. 05/17/2022: PET CT scan: No evidence of progressive metastatic disease.   Similar bone metastases without hypermetabolism, hepatic steatosis 11/23/2022: PET/CT: Stable disease, no metabolically active recurrent or progressive disease, stable bone mets, hepatic steatosis 05/10/2023: PET/CT: stable disease 11/15/2023: PET/CT interval mildly increased metabolic activity within the left L4 pedicle SUV 7.5 nonspecific (facet disease versus metastatic disease) stable bone metastases 02/27/2024: Bone scan: Scattered low-level osseous uptake consistent with known metastatic disease.  Mildly asymmetric uptake within the posterior elements of L4 (indeterminate findings for residual active disease)   Radiology review: I discussed the bone scan findings with the patient.  The findings suggest stable disease. Zometa  is every 6 months. Follow-up in 3 months with another PET/CT scan      No orders of the defined types were placed in this encounter.  The patient has a good understanding of the overall plan. she agrees with it. she will call with any problems that may  develop before the next visit here.  I personally spent a total of 30 minutes in the care of the patient today including preparing to see the patient, getting/reviewing separately obtained history, performing a medically appropriate exam/evaluation, counseling and educating, placing orders, referring and communicating with other health care professionals, documenting clinical information in the EHR, independently interpreting results, communicating results, and coordinating care.   Dr.Brisa Auth 03/06/24    "

## 2024-03-06 NOTE — Assessment & Plan Note (Signed)
 Right breast biopsy on 09/25/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  Patient moved from [place]  because she could not tolerate the heat.   Current treatment: Abemaciclib , anastrozole  and Zometa     Toxicities: 1.  Elevated LFTs: Currently on 100 mg p.o. twice daily.  She is tolerating it extremely well with normal LFTs. 2. leukopenia/neutropenia: ANC 2.2 3.  Thrombocytopenia: Platelets 149 today   Bone metastases: On Zometa  every 3 months Zometa  toxicity: She developed chills aches and pains for 2 days after Zometa  infusion.  Therefore we reduced the dosage of Zometa  to 3 mg    Hypothyroidism: on thyroid  medication. Depression:Starting her on Lexapro    11/28/2021: PET CT scan: No significant interval change in bone mets. 05/17/2022: PET CT scan: No evidence of progressive metastatic disease.  Similar bone metastases without hypermetabolism, hepatic steatosis 11/23/2022: PET/CT: Stable disease, no metabolically active recurrent or progressive disease, stable bone mets, hepatic steatosis 05/10/2023: PET/CT: stable disease 11/15/2023: PET/CT interval mildly increased metabolic activity within the left L4 pedicle SUV 7.5 nonspecific (facet disease versus metastatic disease) stable bone metastases 02/27/2024: Bone scan: Scattered low-level osseous uptake consistent with known metastatic disease.  Mildly asymmetric uptake within the posterior elements of L4 (indeterminate findings for residual active disease)   Radiology review: I discussed the bone scan findings with the patient.   Follow-up in 3 months with another PET/CT scan

## 2024-03-07 LAB — THYROID PANEL WITH TSH
Free Thyroxine Index: 1.8 (ref 1.2–4.9)
T3 Uptake Ratio: 23 % — ABNORMAL LOW (ref 24–39)
T4, Total: 7.7 ug/dL (ref 4.5–12.0)
TSH: 1.11 u[IU]/mL (ref 0.450–4.500)

## 2024-03-13 ENCOUNTER — Other Ambulatory Visit: Payer: Self-pay | Admitting: Hematology and Oncology

## 2024-03-13 DIAGNOSIS — C50919 Malignant neoplasm of unspecified site of unspecified female breast: Secondary | ICD-10-CM

## 2024-05-12 ENCOUNTER — Inpatient Hospital Stay: Attending: Hematology and Oncology

## 2024-05-12 ENCOUNTER — Inpatient Hospital Stay

## 2024-06-11 ENCOUNTER — Inpatient Hospital Stay

## 2024-06-11 ENCOUNTER — Inpatient Hospital Stay: Attending: Hematology and Oncology | Admitting: Hematology and Oncology
